# Patient Record
Sex: Female | Born: 1965 | Race: White | Hispanic: No | Marital: Married | Smoking: Never smoker
Health system: Southern US, Community
[De-identification: ages and names within clinical notes are randomized; demographics above are authoritative.]

## PROBLEM LIST (undated history)

## (undated) DIAGNOSIS — R748 Abnormal levels of other serum enzymes: Secondary | ICD-10-CM

## (undated) DIAGNOSIS — K5720 Diverticulitis of large intestine with perforation and abscess without bleeding: Secondary | ICD-10-CM

## (undated) DIAGNOSIS — K219 Gastro-esophageal reflux disease without esophagitis: Secondary | ICD-10-CM

## (undated) DIAGNOSIS — I10 Essential (primary) hypertension: Secondary | ICD-10-CM

## (undated) HISTORY — DX: Essential (primary) hypertension: I10

## (undated) HISTORY — DX: Diverticulitis of large intestine with perforation and abscess without bleeding: K57.20

## (undated) HISTORY — PX: UPPER GASTROINTESTINAL ENDOSCOPY: SHX188

---

## 2001-09-20 ENCOUNTER — Other Ambulatory Visit: Admission: RE | Admit: 2001-09-20 | Discharge: 2001-09-20 | Payer: Self-pay | Admitting: Obstetrics and Gynecology

## 2002-10-23 ENCOUNTER — Other Ambulatory Visit: Admission: RE | Admit: 2002-10-23 | Discharge: 2002-10-23 | Payer: Self-pay | Admitting: Obstetrics and Gynecology

## 2003-10-31 ENCOUNTER — Other Ambulatory Visit: Admission: RE | Admit: 2003-10-31 | Discharge: 2003-10-31 | Payer: Self-pay | Admitting: Obstetrics and Gynecology

## 2005-06-24 ENCOUNTER — Other Ambulatory Visit: Admission: RE | Admit: 2005-06-24 | Discharge: 2005-06-24 | Payer: Self-pay | Admitting: Obstetrics and Gynecology

## 2012-07-16 ENCOUNTER — Ambulatory Visit (HOSPITAL_COMMUNITY)
Admission: RE | Admit: 2012-07-16 | Discharge: 2012-07-16 | Disposition: A | Discharge status: Home / Self Care | Payer: BC Managed Care – PPO | Source: Ambulatory Visit | Attending: *Deleted | Admitting: *Deleted

## 2012-07-16 ENCOUNTER — Inpatient Hospital Stay (HOSPITAL_COMMUNITY)
Admission: EM | Admit: 2012-07-16 | Discharge: 2012-07-20 | DRG: 183 | Disposition: A | Discharge status: Home / Self Care | Payer: BC Managed Care – PPO | Attending: Surgery | Admitting: Surgery

## 2012-07-16 ENCOUNTER — Encounter (HOSPITAL_COMMUNITY): Payer: Self-pay | Admitting: *Deleted

## 2012-07-16 ENCOUNTER — Ambulatory Visit (HOSPITAL_COMMUNITY)
Admission: AD | Admit: 2012-07-16 | Discharge: 2012-07-16 | Disposition: A | Discharge status: Home / Self Care | Payer: BC Managed Care – PPO | Source: Ambulatory Visit | Attending: *Deleted | Admitting: *Deleted

## 2012-07-16 ENCOUNTER — Other Ambulatory Visit (HOSPITAL_COMMUNITY): Payer: Self-pay | Admitting: *Deleted

## 2012-07-16 DIAGNOSIS — Z23 Encounter for immunization: Secondary | ICD-10-CM

## 2012-07-16 DIAGNOSIS — R109 Unspecified abdominal pain: Secondary | ICD-10-CM

## 2012-07-16 DIAGNOSIS — K5780 Diverticulitis of intestine, part unspecified, with perforation and abscess without bleeding: Secondary | ICD-10-CM

## 2012-07-16 DIAGNOSIS — K5732 Diverticulitis of large intestine without perforation or abscess without bleeding: Secondary | ICD-10-CM

## 2012-07-16 LAB — CBC WITH DIFFERENTIAL/PLATELET
Basophils Absolute: 0 10*3/uL (ref 0.0–0.1)
Basophils Relative: 0 % (ref 0–1)
Eosinophils Absolute: 0.1 10*3/uL (ref 0.0–0.7)
Eosinophils Relative: 1 % (ref 0–5)
HCT: 39.4 % (ref 36.0–46.0)
Hemoglobin: 13.3 g/dL (ref 12.0–15.0)
Lymphocytes Relative: 20 % (ref 12–46)
Lymphs Abs: 2.4 10*3/uL (ref 0.7–4.0)
MCH: 29.7 pg (ref 26.0–34.0)
MCHC: 33.8 g/dL (ref 30.0–36.0)
MCV: 87.9 fL (ref 78.0–100.0)
Monocytes Absolute: 0.8 10*3/uL (ref 0.1–1.0)
Monocytes Relative: 7 % (ref 3–12)
Neutro Abs: 8.5 10*3/uL — ABNORMAL HIGH (ref 1.7–7.7)
Neutrophils Relative %: 72 % (ref 43–77)
Platelets: 205 10*3/uL (ref 150–400)
RBC: 4.48 MIL/uL (ref 3.87–5.11)
RDW: 12.7 % (ref 11.5–15.5)
WBC: 11.8 10*3/uL — ABNORMAL HIGH (ref 4.0–10.5)

## 2012-07-16 LAB — BASIC METABOLIC PANEL
BUN: 9 mg/dL (ref 6–23)
CO2: 27 mEq/L (ref 19–32)
Calcium: 9 mg/dL (ref 8.4–10.5)
Chloride: 96 mEq/L (ref 96–112)
Creatinine, Ser: 0.78 mg/dL (ref 0.50–1.10)
GFR calc Af Amer: >90 mL/min (ref >90)
GFR calc non Af Amer: >90 mL/min (ref >90)
Glucose, Bld: 91 mg/dL (ref 70–99)
Potassium: 3.1 mEq/L — ABNORMAL LOW (ref 3.5–5.1)
Sodium: 134 mEq/L — ABNORMAL LOW (ref 135–145)

## 2012-07-16 LAB — LACTIC ACID, PLASMA: Lactic Acid, Venous: 1.2 mmol/L (ref 0.5–2.2)

## 2012-07-16 MED ORDER — DIPHENHYDRAMINE HCL 50 MG/ML IJ SOLN: 12.5000 mg | Freq: Four times a day (QID) | INTRAMUSCULAR | Status: DC | PRN

## 2012-07-16 MED ORDER — DIPHENHYDRAMINE HCL 12.5 MG/5ML PO ELIX: 12.5000 mg | ORAL_SOLUTION | Freq: Four times a day (QID) | ORAL | Status: DC | PRN

## 2012-07-16 MED ORDER — KCL IN DEXTROSE-NACL 20-5-0.45 MEQ/L-%-% IV SOLN
INTRAVENOUS | Status: DC
  Administered 2012-07-17 – 2012-07-18 (×4): via INTRAVENOUS
  Filled 2012-07-16 (×10): qty 1000

## 2012-07-16 MED ORDER — HYDROMORPHONE HCL PF 1 MG/ML IJ SOLN: 0.5000 mg | INTRAMUSCULAR | Status: DC | PRN

## 2012-07-16 MED ORDER — SODIUM CHLORIDE 0.9 % IV BOLUS (SEPSIS)
1000.0000 mL | Freq: Once | INTRAVENOUS | Status: AC
  Administered 2012-07-16: 1000 mL via INTRAVENOUS

## 2012-07-16 MED ORDER — METRONIDAZOLE IN NACL 5-0.79 MG/ML-% IV SOLN
500.0000 mg | Freq: Once | INTRAVENOUS | Status: AC
  Administered 2012-07-16: 500 mg via INTRAVENOUS
  Filled 2012-07-16: qty 100

## 2012-07-16 MED ORDER — PANTOPRAZOLE SODIUM 40 MG IV SOLR
40.0000 mg | Freq: Every day | INTRAVENOUS | Status: DC
  Administered 2012-07-17 – 2012-07-18 (×3): 40 mg via INTRAVENOUS
  Filled 2012-07-16 (×4): qty 40

## 2012-07-16 MED ORDER — IOHEXOL 300 MG/ML  SOLN
100.0000 mL | Freq: Once | INTRAMUSCULAR | Status: AC | PRN
  Administered 2012-07-16: 100 mL via INTRAVENOUS

## 2012-07-16 MED ORDER — CIPROFLOXACIN IN D5W 400 MG/200ML IV SOLN
400.0000 mg | Freq: Once | INTRAVENOUS | Status: AC
  Administered 2012-07-16: 400 mg via INTRAVENOUS
  Filled 2012-07-16: qty 200

## 2012-07-16 MED ORDER — ENOXAPARIN SODIUM 40 MG/0.4ML ~~LOC~~ SOLN
40.0000 mg | SUBCUTANEOUS | Status: DC
  Administered 2012-07-17 – 2012-07-19 (×3): 40 mg via SUBCUTANEOUS
  Filled 2012-07-16 (×4): qty 0.4

## 2012-07-16 MED ORDER — METRONIDAZOLE IN NACL 5-0.79 MG/ML-% IV SOLN
500.0000 mg | Freq: Three times a day (TID) | INTRAVENOUS | Status: DC
Start: 2012-07-17 — End: 2012-07-20
  Administered 2012-07-17 – 2012-07-20 (×9): 500 mg via INTRAVENOUS
  Filled 2012-07-16 (×13): qty 100

## 2012-07-16 MED ORDER — ONDANSETRON HCL 4 MG/2ML IJ SOLN: 4.0000 mg | Freq: Four times a day (QID) | INTRAMUSCULAR | Status: DC | PRN

## 2012-07-16 MED ORDER — CIPROFLOXACIN IN D5W 400 MG/200ML IV SOLN
400.0000 mg | Freq: Two times a day (BID) | INTRAVENOUS | Status: DC
  Administered 2012-07-17 – 2012-07-19 (×6): 400 mg via INTRAVENOUS
  Filled 2012-07-16 (×8): qty 200

## 2012-07-16 NOTE — ED Provider Notes (Signed)
History    46 year old female with abdominal pain. Gradual onset about a week ago. Left-sided. Progressively worsening. Relatively constant. Associated with multiple episodes of diarrhea. Nonbloody. Mild nausea but no vomiting. No fevers or chills. Patient was referred by her PCP for CT scan today. This was significant for diverticulitis with a small amount of intraluminal air consistent with a microperforation. No evidence of abscess. Referred to emergency room. Patient is otherwise healthy. No previous abdominal or pelvic surgeries.  CSN: [id number]  Arrival date & time 07/16/12  1951   First MD Initiated Contact with Patient 07/16/12 1954      Chief Complaint  Patient presents with  . Abdominal Pain    (Consider location/radiation/quality/duration/timing/severity/associated sxs/prior treatment) HPI  History reviewed. No pertinent past medical history.  History reviewed. No pertinent past surgical history.  History reviewed. No pertinent family history.  History  Substance Use Topics  . Smoking status: Never Smoker   . Smokeless tobacco: Not on file  . Alcohol Use: No    OB History    Grav Para Term Preterm Abortions TAB SAB Ect Mult Living                  Review of Systems   Review of symptoms negative unless otherwise noted in HPI.   Allergies  Review of patient's allergies indicates no known allergies.  Home Medications   Current Outpatient Rx  Name  Route  Sig  Dispense  Refill  . IBUPROFEN 200 MG PO TABS   Oral   Take 200 mg by mouth every 6 (six) hours as needed. For pain           There were no vitals taken for this visit.  Physical Exam  Nursing note and vitals reviewed. Constitutional: She appears well-developed and well-nourished. No distress.  HENT:  Head: Normocephalic and atraumatic.  Eyes: Conjunctivae normal are normal. Right eye exhibits no discharge. Left eye exhibits no discharge.  Neck: Neck supple.  Cardiovascular: Normal rate,  regular rhythm and normal heart sounds.  Exam reveals no gallop and no friction rub.   No murmur heard. Pulmonary/Chest: Effort normal and breath sounds normal. No respiratory distress.  Abdominal: Soft. She exhibits no distension. There is tenderness.       Mild left-sided abdominal tenderness. No rebound or guarding. No distention. No surgical scars noted.  Musculoskeletal: She exhibits no edema and no tenderness.  Neurological: She is alert.  Skin: Skin is warm and dry.  Psychiatric: She has a normal mood and affect. Her behavior is normal. Thought content normal.    ED Course  Procedures (including critical care time)  Labs Reviewed  CBC WITH DIFFERENTIAL - Abnormal; Notable for the following:    WBC 11.8 (*)     Neutro Abs 8.5 (*)     All other components within normal limits  BASIC METABOLIC PANEL - Abnormal; Notable for the following:    Sodium 134 (*)     Potassium 3.1 (*)     All other components within normal limits  LACTIC ACID, PLASMA   Ct Abdomen Pelvis W Contrast  07/16/2012  *RADIOLOGY REPORT*  Clinical Data: 1-week history of left lower quadrant abdominal pain, nausea and vomiting, and diarrhea.  CT ABDOMEN AND PELVIS WITH CONTRAST  Technique:  Multidetector CT imaging of the abdomen and pelvis was performed following the standard protocol during bolus administration of intravenous contrast.  Contrast: OMNIPAQUE IOHEXOL 300 MG/ML. Oral contrast was also administered.  Comparison: None.  Findings:  Wall thickening involving the distal sigmoid colon where there is a diverticulum.  There is associated pericolonic edema/inflammation.  Extraluminal gas is present, though there is no associated fluid collection.  The remainder the colon is normal in appearance.  Stomach decompressed and unremarkable by CT.  Wall thickening involving the terminal ileum over an approximate 6 cm segment. Remainder of the small bowel normal in appearance.  Normal appendix in the right upper  pelvis.  No ascites.  Normal appearing liver, spleen, pancreas, adrenal glands, and kidneys.  Focus of accessory splenic tissue medial to the lower pole of the spleen.  No visible aorto-iliofemoral atherosclerosis. No significant lymphadenopathy.  Gallbladder unremarkable by CT. No biliary ductal dilation.  Uterus and ovaries normal in appearance.  No free pelvic fluid. Urinary bladder unremarkable.  Bone window images demonstrate lower thoracic spondylosis and degenerative changes involving the lower lumbar facet joints.  Visualized lung bases clear.  IMPRESSION:  1.  Acute diverticulitis involving the distal sigmoid colon.  Small amount of extraluminal gas consistent with a microperforation.  No associated abscess. 2.  Wall thickening over approximate 6 cm segment of the terminal ileum.  This could be infectious or inflammatory (Crohn's?)   Original Report Authenticated By: Hulan Saas, M.D.      1. Perforated diverticulitis       MDM  46 year old female with abdominal pain. CT significant for perforated diverticulitis. Not peritoneal.  N.p.o. Antibiotics. Surgical consult. Admission.        Raeford Razor, MD 07/16/12 2110

## 2012-07-16 NOTE — H&P (Signed)
Lisa Tyler is an 46 y.o. female.   Chief Complaint: Left lower quadrant abdominal pain and fever HPI: Patient developed left-sided abdominal pain and fever 6 days ago. It improved after 2 days but returned earlier today with more fever. The pain is now more in the left lower quadrant. She has associated nausea but no vomiting. Bowel movements have been loose. She underwent CT scan of the abdomen and pelvis in the emergency department and shows sigmoid diverticulitis with microperforation. It also shows some thickening of the terminal ileum. There is no abscess.  History reviewed. No pertinent past medical history.  History reviewed. No pertinent past surgical history.  History reviewed. No pertinent family history. Social History:  reports that she has never smoked. She does not have any smokeless tobacco history on file. She reports that she does not drink alcohol or use illicit drugs.  Allergies: No Known Allergies   (Not in a hospital admission)  Results for orders placed during the hospital encounter of 07/16/12 (from the past 48 hour(s))  CBC WITH DIFFERENTIAL     Status: Abnormal   Collection Time   07/16/12  7:59 PM      Component Value Range Comment   WBC 11.8 (*) 4.0 - 10.5 K/uL    RBC 4.48  3.87 - 5.11 MIL/uL    Hemoglobin 13.3  12.0 - 15.0 g/dL    HCT 16.1  09.6 - 04.5 %    MCV 87.9  78.0 - 100.0 fL    MCH 29.7  26.0 - 34.0 pg    MCHC 33.8  30.0 - 36.0 g/dL    RDW 40.9  81.1 - 91.4 %    Platelets 205  150 - 400 K/uL    Neutrophils Relative 72  43 - 77 %    Lymphocytes Relative 20  12 - 46 %    Monocytes Relative 7  3 - 12 %    Eosinophils Relative 1  0 - 5 %    Basophils Relative 0  0 - 1 %    Neutro Abs 8.5 (*) 1.7 - 7.7 K/uL    Lymphs Abs 2.4  0.7 - 4.0 K/uL    Monocytes Absolute 0.8  0.1 - 1.0 K/uL    Eosinophils Absolute 0.1  0.0 - 0.7 K/uL    Basophils Absolute 0.0  0.0 - 0.1 K/uL   BASIC METABOLIC PANEL     Status: Abnormal   Collection Time   07/16/12   7:59 PM      Component Value Range Comment   Sodium 134 (*) 135 - 145 mEq/L    Potassium 3.1 (*) 3.5 - 5.1 mEq/L    Chloride 96  96 - 112 mEq/L    CO2 27  19 - 32 mEq/L    Glucose, Bld 91  70 - 99 mg/dL    BUN 9  6 - 23 mg/dL    Creatinine, Ser 7.82  0.50 - 1.10 mg/dL    Calcium 9.0  8.4 - 95.6 mg/dL    GFR calc non Af Amer >90  >90 mL/min    GFR calc Af Amer >90  >90 mL/min   LACTIC ACID, PLASMA     Status: Normal   Collection Time   07/16/12  7:59 PM      Component Value Range Comment   Lactic Acid, Venous 1.2  0.5 - 2.2 mmol/L    Ct Abdomen Pelvis W Contrast  07/16/2012  *RADIOLOGY REPORT*  Clinical Data: 1-week history  of left lower quadrant abdominal pain, nausea and vomiting, and diarrhea.  CT ABDOMEN AND PELVIS WITH CONTRAST  Technique:  Multidetector CT imaging of the abdomen and pelvis was performed following the standard protocol during bolus administration of intravenous contrast.  Contrast: OMNIPAQUE IOHEXOL 300 MG/ML. Oral contrast was also administered.  Comparison: None.  Findings: Wall thickening involving the distal sigmoid colon where there is a diverticulum.  There is associated pericolonic edema/inflammation.  Extraluminal gas is present, though there is no associated fluid collection.  The remainder the colon is normal in appearance.  Stomach decompressed and unremarkable by CT.  Wall thickening involving the terminal ileum over an approximate 6 cm segment. Remainder of the small bowel normal in appearance.  Normal appendix in the right upper pelvis.  No ascites.  Normal appearing liver, spleen, pancreas, adrenal glands, and kidneys.  Focus of accessory splenic tissue medial to the lower pole of the spleen.  No visible aorto-iliofemoral atherosclerosis. No significant lymphadenopathy.  Gallbladder unremarkable by CT. No biliary ductal dilation.  Uterus and ovaries normal in appearance.  No free pelvic fluid. Urinary bladder unremarkable.  Bone window images demonstrate  lower thoracic spondylosis and degenerative changes involving the lower lumbar facet joints.  Visualized lung bases clear.  IMPRESSION:  1.  Acute diverticulitis involving the distal sigmoid colon.  Small amount of extraluminal gas consistent with a microperforation.  No associated abscess. 2.  Wall thickening over approximate 6 cm segment of the terminal ileum.  This could be infectious or inflammatory (Crohn's?)   Original Report Authenticated By: Hulan Saas, M.D.     Review of Systems  Constitutional: Positive for fever. Negative for chills.  HENT: Negative.   Eyes: Negative.   Respiratory: Negative.   Cardiovascular: Negative.   Gastrointestinal: Positive for nausea, abdominal pain and diarrhea. Negative for vomiting and constipation.  Genitourinary: Negative.   Musculoskeletal: Negative.   Skin: Negative.   Neurological: Negative.   Endo/Heme/Allergies: Negative.     Blood pressure 120/74, pulse 99, temperature 99 F (37.2 C), temperature source Oral, resp. rate 18, SpO2 95.00%. Physical Exam  Constitutional: She is oriented to person, place, and time. She appears well-developed and well-nourished. No distress.  HENT:  Head: Normocephalic and atraumatic.  Mouth/Throat: No oropharyngeal exudate.  Eyes: EOM are normal. Pupils are equal, round, and reactive to light. No scleral icterus.  Neck: Normal range of motion. No tracheal deviation present.  Cardiovascular: Normal rate, regular rhythm, normal heart sounds and intact distal pulses.   Respiratory: Effort normal and breath sounds normal. No stridor. No respiratory distress. She has no wheezes. She has no rales.  GI: Soft. She exhibits no distension. There is tenderness. There is no rebound and no guarding.       Tender left lower quadrant and suprapubic with no guarding  Musculoskeletal: Normal range of motion.  Neurological: She is alert and oriented to person, place, and time.  Skin: Skin is warm.      Assessment/Plan Sigmoid diverticulitis with microperforation. Possible inflammatory bowel disease involving the terminal ileum. Plan admission, bowel rest, and IV antibiotics. If the patient worsens, she will need colectomy and colostomy. This was discussed with her. I answered her questions.  Lennon Richins E 07/16/2012, 9:31 PM

## 2012-07-16 NOTE — ED Notes (Signed)
Pt ambulatory to restroom with steady gait, pt denies any diarrhea episode. Pt continues to deny any pain or complaints at this time and will continue to monitor pt.

## 2012-07-16 NOTE — ED Notes (Signed)
Pt states she has no history of diverticulitis. Pt states that her abdominal pain started Mon. Generalized abdominal pain then throughout the week it "settled" to her left lower abdomen. Pt saw PCP and was sent over for a outpatient CT scan. Pt scan did show diverticulitis and a micro-bowel perforation. PCP wanted pt seen in ED and admitted for observation of bowel perforation.

## 2012-07-17 LAB — CBC
HCT: 34.5 % — ABNORMAL LOW (ref 36.0–46.0)
Hemoglobin: 11.4 g/dL — ABNORMAL LOW (ref 12.0–15.0)
MCH: 29.2 pg (ref 26.0–34.0)
MCHC: 33 g/dL (ref 30.0–36.0)
MCV: 88.2 fL (ref 78.0–100.0)
Platelets: 200 10*3/uL (ref 150–400)
RBC: 3.91 MIL/uL (ref 3.87–5.11)
RDW: 12.8 % (ref 11.5–15.5)
WBC: 9 10*3/uL (ref 4.0–10.5)

## 2012-07-17 LAB — BASIC METABOLIC PANEL
BUN: 6 mg/dL (ref 6–23)
CO2: 27 mEq/L (ref 19–32)
Calcium: 8.3 mg/dL — ABNORMAL LOW (ref 8.4–10.5)
Chloride: 104 mEq/L (ref 96–112)
Creatinine, Ser: 0.78 mg/dL (ref 0.50–1.10)
GFR calc Af Amer: >90 mL/min (ref >90)
GFR calc non Af Amer: >90 mL/min (ref >90)
Glucose, Bld: 98 mg/dL (ref 70–99)
Potassium: 3.4 mEq/L — ABNORMAL LOW (ref 3.5–5.1)
Sodium: 140 mEq/L (ref 135–145)

## 2012-07-17 MED ORDER — ACETAMINOPHEN 325 MG PO TABS
650.0000 mg | ORAL_TABLET | Freq: Four times a day (QID) | ORAL | Status: DC | PRN
  Administered 2012-07-17: 650 mg via ORAL
  Filled 2012-07-17 (×2): qty 2

## 2012-07-17 MED ORDER — INFLUENZA VIRUS VACC SPLIT PF IM SUSP
0.5000 mL | INTRAMUSCULAR | Status: AC
  Administered 2012-07-17: 0.5 mL via INTRAMUSCULAR
  Filled 2012-07-17: qty 0.5

## 2012-07-17 NOTE — Progress Notes (Signed)
Patient ID: Lisa Tyler, female   DOB: [birth year], 46 y.o.   MRN: [id number]    Subjective: Patient doing well today, minimal pain except with ambulation.  Pt denies any BM, but has + flatus and normal urination.  Pt is hungry and thirsty.  Pt c/o of a headache, but no other complaints.  Pt's temp was elevated at 100.3*F.  Objective: Vital signs in last 24 hours: Temp:  [98.8 F (37.1 C)-100.3 F (37.9 C)] 100.3 F (37.9 C) (11/04 0757) Pulse Rate:  [92-99] 92  (11/04 0757) Resp:  [16-19] 18  (11/04 0754) BP: (113-123)/(63-76) 118/73 mmHg (11/04 0757) SpO2:  [93 %-97 %] 95 % (11/04 0757) Weight:  [214 lb 11.2 oz (97.387 kg)] 214 lb 11.2 oz (97.387 kg) (11/04 0300) Last BM Date: 07/16/12   PE: Gen:  Alert, NAD, pleasant Abd: Soft, +BS, minimal distension and moderate TTP in RLQ  Lab Results:   [facility] 07/17/12 0635 07/16/12 1959  WBC 9.0 11.8*  HGB 11.4* 13.3  HCT 34.5* 39.4  PLT 200 205   BMET  Basename 07/17/12 0635 07/16/12 1959  NA 140 134*  K 3.4* 3.1*  CL 104 96  CO2 27 27  GLUCOSE 98 91  BUN 6 9  CREATININE 0.78 0.78  CALCIUM 8.3* 9.0   PT/INR No results found for this basename: LABPROT:2,INR:2 in the last 72 hours CMP     Component Value Date/Time   NA 140 07/17/2012 0635   K 3.4* 07/17/2012 0635   CL 104 07/17/2012 0635   CO2 27 07/17/2012 0635   GLUCOSE 98 07/17/2012 0635   BUN 6 07/17/2012 0635   CREATININE 0.78 07/17/2012 0635   CALCIUM 8.3* 07/17/2012 0635   GFRNONAA >90 07/17/2012 0635   GFRAA >90 07/17/2012 0635   Lipase  No results found for this basename: lipase       Studies/Results: Ct Abdomen Pelvis W Contrast  07/16/2012  *RADIOLOGY REPORT*  Clinical Data: 1-week history of left lower quadrant abdominal pain, nausea and vomiting, and diarrhea.  CT ABDOMEN AND PELVIS WITH CONTRAST  Technique:  Multidetector CT imaging of the abdomen and pelvis was performed following the standard protocol during bolus administration of intravenous  contrast.  Contrast: OMNIPAQUE IOHEXOL 300 MG/ML. Oral contrast was also administered.  Comparison: None.  Findings: Wall thickening involving the distal sigmoid colon where there is a diverticulum.  There is associated pericolonic edema/inflammation.  Extraluminal gas is present, though there is no associated fluid collection.  The remainder the colon is normal in appearance.  Stomach decompressed and unremarkable by CT.  Wall thickening involving the terminal ileum over an approximate 6 cm segment. Remainder of the small bowel normal in appearance.  Normal appendix in the right upper pelvis.  No ascites.  Normal appearing liver, spleen, pancreas, adrenal glands, and kidneys.  Focus of accessory splenic tissue medial to the lower pole of the spleen.  No visible aorto-iliofemoral atherosclerosis. No significant lymphadenopathy.  Gallbladder unremarkable by CT. No biliary ductal dilation.  Uterus and ovaries normal in appearance.  No free pelvic fluid. Urinary bladder unremarkable.  Bone window images demonstrate lower thoracic spondylosis and degenerative changes involving the lower lumbar facet joints.  Visualized lung bases clear.  IMPRESSION:  1.  Acute diverticulitis involving the distal sigmoid colon.  Small amount of extraluminal gas consistent with a microperforation.  No associated abscess. 2.  Wall thickening over approximate 6 cm segment of the terminal ileum.  This could be infectious or inflammatory (Crohn's?)  Original Report Authenticated By: Hulan Saas, M.D.     Anti-infectives: Anti-infectives     Start     Dose/Rate Route Frequency Ordered Stop   07/17/12 0900   ciprofloxacin (CIPRO) IVPB 400 mg        400 mg 200 mL/hr over 60 Minutes Intravenous Every 12 hours 07/16/12 2337     07/17/12 0600   metroNIDAZOLE (FLAGYL) IVPB 500 mg        500 mg 100 mL/hr over 60 Minutes Intravenous Every 8 hours 07/16/12 2337     07/16/12 2000   ciprofloxacin (CIPRO) IVPB 400 mg        400  mg 200 mL/hr over 60 Minutes Intravenous  Once 07/16/12 1956 07/16/12 2231   07/16/12 2000   metroNIDAZOLE (FLAGYL) IVPB 500 mg        500 mg 100 mL/hr over 60 Minutes Intravenous  Once 07/16/12 1956 07/16/12 2331           Assessment/Plan  46 y/o female with perforated diverticulitis 2.  Cont antibiotics 3.  Ambulate QID 4.  IS 5.  Bowel rest-will hold off on feeding this AM given intermittent pain, but will likely start clears later today or tomorrow AM.  Headache 1.  PO tylenol with sips only    LOS: 1 day    DORT, Felita Bump 07/17/2012, 8:25 AM Pager: [phone]

## 2012-07-17 NOTE — Progress Notes (Signed)
Continue non-op management Adv diet slowly next few days  The patient is stable.  There is no evidence of peritonitis, acute abdomen, nor shock.  There is no strong evidence of failure of improvement nor decline with current non-operative management.  There is no need for surgery at the present moment.   We will continue to follow.

## 2012-07-18 MED ORDER — KCL IN DEXTROSE-NACL 20-5-0.45 MEQ/L-%-% IV SOLN
INTRAVENOUS | Status: DC
  Administered 2012-07-19 – 2012-07-20 (×2): via INTRAVENOUS
  Filled 2012-07-18 (×4): qty 1000

## 2012-07-18 NOTE — Progress Notes (Signed)
Subjective: Patient doing very well today and is hungry and thirsty.  Pt denies any abdominal pain, N/V.  Pt with +flatus and BM yesterday and this AM.  Pt up ambulating and tolerating ice chips well.    Objective: Vital signs in last 24 hours: Temp:  [97.1 F (36.2 C)-99.3 F (37.4 C)] 97.8 F (36.6 C) (11/05 0618) Pulse Rate:  [76-85] 76  (11/05 0618) Resp:  [18] 18  (11/05 0618) BP: (102-126)/(65-76) 102/65 mmHg (11/05 0618) SpO2:  [98 %-100 %] 98 % (11/05 0618) Last BM Date: 07/16/12  Intake/Output from previous day: 11/04 0701 - 11/05 0700 In: 3540 [I.V.:3240; IV Piggyback:300] Out: 5 [Urine:4; Stool:1] Intake/Output this shift:    PE: Gen:  Alert, NAD, pleasant Abd: Soft, NT/ND, +BS   Lab Results:   [facility] 07/17/12 0635 07/16/12 1959  WBC 9.0 11.8*  HGB 11.4* 13.3  HCT 34.5* 39.4  PLT 200 205   BMET  Basename 07/17/12 0635 07/16/12 1959  NA 140 134*  K 3.4* 3.1*  CL 104 96  CO2 27 27  GLUCOSE 98 91  BUN 6 9  CREATININE 0.78 0.78  CALCIUM 8.3* 9.0   PT/INR No results found for this basename: LABPROT:2,INR:2 in the last 72 hours CMP     Component Value Date/Time   NA 140 07/17/2012 0635   K 3.4* 07/17/2012 0635   CL 104 07/17/2012 0635   CO2 27 07/17/2012 0635   GLUCOSE 98 07/17/2012 0635   BUN 6 07/17/2012 0635   CREATININE 0.78 07/17/2012 0635   CALCIUM 8.3* 07/17/2012 0635   GFRNONAA >90 07/17/2012 0635   GFRAA >90 07/17/2012 0635   Lipase  No results found for this basename: lipase       Studies/Results: Ct Abdomen Pelvis W Contrast  07/16/2012  *RADIOLOGY REPORT*  Clinical Data: 1-week history of left lower quadrant abdominal pain, nausea and vomiting, and diarrhea.  CT ABDOMEN AND PELVIS WITH CONTRAST  Technique:  Multidetector CT imaging of the abdomen and pelvis was performed following the standard protocol during bolus administration of intravenous contrast.  Contrast: OMNIPAQUE IOHEXOL 300 MG/ML. Oral contrast was also  administered.  Comparison: None.  Findings: Wall thickening involving the distal sigmoid colon where there is a diverticulum.  There is associated pericolonic edema/inflammation.  Extraluminal gas is present, though there is no associated fluid collection.  The remainder the colon is normal in appearance.  Stomach decompressed and unremarkable by CT.  Wall thickening involving the terminal ileum over an approximate 6 cm segment. Remainder of the small bowel normal in appearance.  Normal appendix in the right upper pelvis.  No ascites.  Normal appearing liver, spleen, pancreas, adrenal glands, and kidneys.  Focus of accessory splenic tissue medial to the lower pole of the spleen.  No visible aorto-iliofemoral atherosclerosis. No significant lymphadenopathy.  Gallbladder unremarkable by CT. No biliary ductal dilation.  Uterus and ovaries normal in appearance.  No free pelvic fluid. Urinary bladder unremarkable.  Bone window images demonstrate lower thoracic spondylosis and degenerative changes involving the lower lumbar facet joints.  Visualized lung bases clear.  IMPRESSION:  1.  Acute diverticulitis involving the distal sigmoid colon.  Small amount of extraluminal gas consistent with a microperforation.  No associated abscess. 2.  Wall thickening over approximate 6 cm segment of the terminal ileum.  This could be infectious or inflammatory (Crohn's?)   Original Report Authenticated By: Hulan Saas, M.D.     Anti-infectives: Anti-infectives     Start  Dose/Rate Route Frequency Ordered Stop   07/17/12 0900   ciprofloxacin (CIPRO) IVPB 400 mg        400 mg 200 mL/hr over 60 Minutes Intravenous Every 12 hours 07/16/12 2337     07/17/12 0600   metroNIDAZOLE (FLAGYL) IVPB 500 mg        500 mg 100 mL/hr over 60 Minutes Intravenous Every 8 hours 07/16/12 2337     07/16/12 2000   ciprofloxacin (CIPRO) IVPB 400 mg        400 mg 200 mL/hr over 60 Minutes Intravenous  Once 07/16/12 1956 07/16/12 2231    07/16/12 2000   metroNIDAZOLE (FLAGYL) IVPB 500 mg        500 mg 100 mL/hr over 60 Minutes Intravenous  Once 07/16/12 1956 07/16/12 2331           Assessment/Plan  46 y/o female with Perforated diverticulitis 1.  Much improved today, will progress diet to sips (30ml q1h prn) and clears at lunch if tolerated 2.  Cont antibiotics and pain control 3.  Cont ambulation OOB and IS 4.  Will likely go home in a few days    LOS: 2 days    DORT, Demetrus Pavao 07/18/2012, 8:29 AM Pager: [phone]

## 2012-07-18 NOTE — Progress Notes (Signed)
Improving Try to adv diet Cont IV ABx  Hopefully improves, but... CTscan if worse OR if unstable

## 2012-07-19 MED ORDER — PANTOPRAZOLE SODIUM 40 MG PO TBEC
40.0000 mg | DELAYED_RELEASE_TABLET | Freq: Every day | ORAL | Status: DC
  Administered 2012-07-19: 40 mg via ORAL
  Filled 2012-07-19: qty 1

## 2012-07-19 MED ORDER — HYDROCODONE-ACETAMINOPHEN 10-325 MG PO TABS: 2.0000 | ORAL_TABLET | ORAL | Status: DC | PRN

## 2012-07-19 NOTE — Progress Notes (Signed)
  Subjective: Patient has had cervical loose stools. States pain has resolved. Now hungry. Tolerated clear liquids.  Remains afebrile with stable vital signs.  Objective: Vital signs in last 24 hours: Temp:  [97.5 F (36.4 C)-98.2 F (36.8 C)] 98.2 F (36.8 C) (11/06 0648) Pulse Rate:  [72-85] 72  (11/06 0648) Resp:  [18] 18  (11/06 0648) BP: (110-120)/(70-74) 120/72 mmHg (11/06 0648) SpO2:  [97 %-99 %] 97 % (11/06 0648) Last BM Date: 07/18/12  Intake/Output from previous day: 11/05 0701 - 11/06 0700 In: 504.2 [P.O.:240; I.V.:264.2] Out: 9 [Urine:3; Stool:6] Intake/Output this shift:    General appearance: alert. No distress. Comfortable. Normal mental status. GI: abdomen soft. Obese. Nontender. No mass. Not distended. Benign exam.  Lab Results:   [facility] 07/17/12 0635 07/16/12 1959  WBC 9.0 11.8*  HGB 11.4* 13.3  HCT 34.5* 39.4  PLT 200 205   BMET  Basename 07/17/12 0635 07/16/12 1959  NA 140 134*  K 3.4* 3.1*  CL 104 96  CO2 27 27  GLUCOSE 98 91  BUN 6 9  CREATININE 0.78 0.78  CALCIUM 8.3* 9.0   PT/INR No results found for this basename: LABPROT:2,INR:2 in the last 72 hours ABG No results found for this basename: PHART:2,PCO2:2,PO2:2,HCO3:2 in the last 72 hours  Studies/Results: No results found.  Anti-infectives: Anti-infectives     Start     Dose/Rate Route Frequency Ordered Stop   07/17/12 0900   ciprofloxacin (CIPRO) IVPB 400 mg        400 mg 200 mL/hr over 60 Minutes Intravenous Every 12 hours 07/16/12 2337     07/17/12 0600   metroNIDAZOLE (FLAGYL) IVPB 500 mg        500 mg 100 mL/hr over 60 Minutes Intravenous Every 8 hours 07/16/12 2337     07/16/12 2000   ciprofloxacin (CIPRO) IVPB 400 mg        400 mg 200 mL/hr over 60 Minutes Intravenous  Once 07/16/12 1956 07/16/12 2231   07/16/12 2000   metroNIDAZOLE (FLAGYL) IVPB 500 mg        500 mg 100 mL/hr over 60 Minutes Intravenous  Once 07/16/12 1956 07/16/12 2331           Assessment/Plan:  Acute sigmoid diverticulitis with microperforation, first episode. Responding to antibiotics without apparent complication. Continue IV antibiotics Switch to oral pain medication and proton pump inhibitors Advance diet Possible discharge home tomorrow. Would probably give 2 weeks of Cipro and Flagyl Refer for colonoscopy in approximately 2 months.   LOS: 3 days    Marigene Erler M. Derrell Lolling, M.D., [facility] General and Minimally invasive Surgery Breast and Colorectal Surgery Office:   [phone] Pager:   [phone]  07/19/2012

## 2012-07-19 NOTE — Progress Notes (Signed)
Patient tolerated low residue dinner without complaints of nausea or pain.

## 2012-07-20 MED ORDER — CIPROFLOXACIN HCL 500 MG PO TABS: 500.0000 mg | ORAL_TABLET | Freq: Two times a day (BID) | ORAL | Status: DC

## 2012-07-20 MED ORDER — METRONIDAZOLE 500 MG PO TABS: 500.0000 mg | ORAL_TABLET | Freq: Three times a day (TID) | ORAL | Status: DC

## 2012-07-20 NOTE — Discharge Summary (Signed)
  Patient ID: Lisa Tyler [id number] 46 y.o. 1965/12/29  07/16/2012  Discharge date and time: July 20, 2012  Admitting Physician: Ernestene Mention  Discharge Physician: Ernestene Mention  Admission Diagnoses: Perforated diverticulitis [562.11] Sigmoid diverticulitis  Discharge Diagnoses: Sigmoid diverticulitis with perforation  Operations: None  Admission Condition: fair  Discharged Condition: good  Indication for Admission:  Left lower quadrant abdominal pain and fever   Patient developed left-sided abdominal pain and fever 6 days prior to admission.. It improved after 2 days but returned earlier today with more fever. The pain is now more in the left lower quadrant. She has associated nausea but no vomiting. Bowel movements have been loose. She underwent CT scan of the abdomen and pelvis in the emergency department and shows sigmoid diverticulitis with microperforation ,Small amount of extraluminal air, but no abscess.. It also shows some thickening of the terminal ileum. She was admitted for medical management of her diverticulitis. This is her first episode.   Hospital Course: The patient was admitted, placed on IV hydration, bowel rest, and intravenous Cipro and Flagyl. She responded very well to this with rapid reduction and eventual resolution of her pain. We advanced her diet stepwise until she was tolerating a low-residue diet, having bowel movements. She had no pain for at least 36 hours prior to discharge. 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 On the day of discharge her abdomen was soft and completely nontender. No mass. She was advised to hydration, high-fiber low-fat diet. She was told she will eventually need a colonoscopy. She was given prescription for Cipro and Flagyl for 10 days.  Consults: None  Significant Diagnostic Studies: CT scan  Treatments: antibiotics: Cipro and Flagyl  Disposition: Home  Patient Instructions:   Tuesday, [name]  Home Medication Instructions HQI:[id number]   Printed on:07/20/12 1324  Medication Information                    ibuprofen (ADVIL,MOTRIN) 200 MG tablet Take 200 mg by mouth every 6 (six) hours as needed. For pain           ciprofloxacin (CIPRO) 500 MG tablet Take 1 tablet (500 mg total) by mouth 2 (two) times daily.           metroNIDAZOLE (FLAGYL) 500 MG tablet Take 1 tablet (500 mg total) by mouth 3 (three) times daily.             Activity: activity as tolerated Diet: high fiber, low fat diet with hydration stressed. Wound Care: none needed  Follow-up:  With Dr. gross in 1 month.  Signed: Angelia Mould. Derrell Lolling, M.D., FACS General and minimally invasive surgery Breast and Colorectal Surgery  07/20/2012, 6:18 AM

## 2012-07-20 NOTE — Progress Notes (Signed)
  Subjective: Doing well. Denies pain. Denies nausea. Tolerate low-residue diet. Wants to go home. Afebrile.  Objective: Vital signs in last 24 hours: Temp:  [97.7 F (36.5 C)-98.5 F (36.9 C)] 98.5 F (36.9 C) (11/07 0500) Pulse Rate:  [72-82] 78  (11/07 0500) Resp:  [18] 18  (11/07 0500) BP: (113-123)/(70-79) 119/70 mmHg (11/07 0500) SpO2:  [97 %-100 %] 98 % (11/07 0500) Last BM Date: 07/19/12  Intake/Output from previous day: 11/06 0701 - 11/07 0700 In: 2871 [P.O.:1020; I.V.:1551; IV Piggyback:300] Out: 6 [Urine:6] Intake/Output this shift: Total I/O In: 1151 [I.V.:1151] Out: 3 [Urine:3]  General appearance: alert. Mental status normal. No distress. In good spirits. GI: soft, non-tender; bowel sounds normal; no masses,  no organomegaly  Lab Results:  No results found for this or any previous visit (from the past 24 hour(s)).   Studies/Results: @RISRSLT24 @     . ciprofloxacin  400 mg Intravenous Q12H  . enoxaparin  40 mg Subcutaneous Q24H  . metronidazole  500 mg Intravenous Q8H  . pantoprazole  40 mg Oral Daily  . [DISCONTINUED] pantoprazole (PROTONIX) IV  40 mg Intravenous QHS     Assessment/Plan:  Acute sigmoid diverticulitis with microperforation, first episode. Responding to antibiotics without apparent complication.  Discharge home today. Ten-day prescription for Cipro and Flagyl given Does not need pain medication Hydration, high fiber low fat diet discussed in detail Return to the office to see Dr. Karie Soda in 4 weeks.   Refer for colonoscopy in approximately 2 months      LOS: 4 days    Lylian Sanagustin M 07/20/2012  . .prob

## 2012-07-20 NOTE — Progress Notes (Signed)
Patient discharged to home with family.  Discharge teaching completed including follow up care, medications and diet.  Verbalizes understanding with no further questions.  Vital signs stable, no complaints of pain.

## 2012-08-22 ENCOUNTER — Encounter: Payer: Self-pay | Admitting: Gastroenterology

## 2012-08-22 ENCOUNTER — Ambulatory Visit (INDEPENDENT_AMBULATORY_CARE_PROVIDER_SITE_OTHER): Payer: BC Managed Care – PPO | Admitting: Surgery

## 2012-08-22 ENCOUNTER — Encounter (INDEPENDENT_AMBULATORY_CARE_PROVIDER_SITE_OTHER): Payer: Self-pay | Admitting: Surgery

## 2012-08-22 VITALS — BP 128/84 | HR 80 | Temp 98.4°F | Resp 18 | Ht 65.0 in | Wt 214.8 lb

## 2012-08-22 DIAGNOSIS — K5730 Diverticulosis of large intestine without perforation or abscess without bleeding: Secondary | ICD-10-CM

## 2012-08-22 DIAGNOSIS — K5720 Diverticulitis of large intestine with perforation and abscess without bleeding: Secondary | ICD-10-CM

## 2012-08-22 DIAGNOSIS — K5000 Crohn's disease of small intestine without complications: Secondary | ICD-10-CM

## 2012-08-22 DIAGNOSIS — E669 Obesity, unspecified: Secondary | ICD-10-CM | POA: Insufficient documentation

## 2012-08-22 DIAGNOSIS — K5732 Diverticulitis of large intestine without perforation or abscess without bleeding: Secondary | ICD-10-CM

## 2012-08-22 HISTORY — DX: Diverticulitis of large intestine with perforation and abscess without bleeding: K57.20

## 2012-08-22 NOTE — Progress Notes (Signed)
Subjective:     Patient ID: Lisa Tyler, female   DOB: October 17, 1965, 46 y.o.   MRN: [id number]  HPI  Lisa Tyler  01-26-1966 [id number]  Patient Care Team: Ernestina Penna, MD as PCP - General (Family Medicine)  This patient is a 46 y.o.female who presents today for surgical evaluation.   Reason for visit: Followup on attack of diverticulitis.  Pleasant obese female that had diverticulitis with a microperforation.  Was admitted and placed on IV antibiotics.  Improved transitioned to oral antibiotics.  She has not completed them.  She is pain-free now.  She is back to her baseline twice a day bowel movements.  No fevers chills or sweats. Advanced back to her usual diet.  Energy level is good.  She has never had any prior attacks of diverticulitis or any other bowel problems.  She recalls a maternal cousin with Crohn's but no other history of bowel problems.  No personal nor family history of GI/colon cancer, irritable bowel syndrome, allergy such as Celiac Sprue, dietary/dairy problems, colitis, ulcers nor gastritis.  No recent sick contacts/gastroenteritis.  No travel outside the country.  No changes in diet.    Patient Active Problem List  Diagnosis  . Diverticulosis of colon    Past Medical History  Diagnosis Date  . Diverticulitis of colon with perforation 08/22/2012    No past surgical history on file.  History   Social History  . Marital Status: Single    Spouse Name: N/A    Number of Children: N/A  . Years of Education: N/A   Occupational History  . Not on file.   Social History Main Topics  . Smoking status: Never Smoker   . Smokeless tobacco: Not on file  . Alcohol Use: No  . Drug Use: No  . Sexually Active:    Other Topics Concern  . Not on file   Social History Narrative  . No narrative on file    No family history on file.  Current Outpatient Prescriptions  Medication Sig Dispense Refill  . ibuprofen (ADVIL,MOTRIN) 200 MG tablet Take 200 mg  by mouth every 6 (six) hours as needed. For pain      . ciprofloxacin (CIPRO) 500 MG tablet Take 1 tablet (500 mg total) by mouth 2 (two) times daily.  20 tablet  2  . metroNIDAZOLE (FLAGYL) 500 MG tablet Take 1 tablet (500 mg total) by mouth 3 (three) times daily.  30 tablet  0     No Known Allergies  BP 128/84  Pulse 80  Temp 98.4 F (36.9 C) (Temporal)  Resp 18  Ht 5\' 5"  (1.651 m)  Wt 214 lb 12.8 oz (97.433 kg)  BMI 35.74 kg/m2  No results found.   Review of Systems  Constitutional: Negative for fever, chills, diaphoresis, appetite change and fatigue.  HENT: Negative for ear pain, sore throat, trouble swallowing, neck pain and ear discharge.   Eyes: Negative for photophobia, discharge and visual disturbance.  Respiratory: Negative for cough, choking, chest tightness and shortness of breath.   Cardiovascular: Negative for chest pain and palpitations.  Gastrointestinal: Negative for nausea, vomiting, abdominal pain, diarrhea, constipation, anal bleeding and rectal pain.  Genitourinary: Negative for dysuria, frequency and difficulty urinating.  Musculoskeletal: Negative for myalgias and gait problem.  Skin: Negative for color change, pallor and rash.  Neurological: Negative for dizziness, speech difficulty, weakness and numbness.  Hematological: Negative for adenopathy.  Psychiatric/Behavioral: Negative for confusion and agitation. The patient is not nervous/anxious.  Objective:   Physical Exam  Constitutional: She is oriented to person, place, and time. She appears well-developed and well-nourished. No distress.  HENT:  Head: Normocephalic.  Mouth/Throat: Oropharynx is clear and moist. No oropharyngeal exudate.  Eyes: Conjunctivae normal and EOM are normal. Pupils are equal, round, and reactive to light. No scleral icterus.  Neck: Normal range of motion. No tracheal deviation present.  Cardiovascular: Normal rate and intact distal pulses.   Pulmonary/Chest: Effort  normal. No respiratory distress. She exhibits no tenderness.  Abdominal: Soft. She exhibits no distension. There is no tenderness. Hernia confirmed negative in the right inguinal area and confirmed negative in the left inguinal area.       Incisions clean with normal healing ridges.  No hernias  Genitourinary: No vaginal discharge found.  Musculoskeletal: Normal range of motion. She exhibits no tenderness.  Lymphadenopathy:       Right: No inguinal adenopathy present.       Left: No inguinal adenopathy present.  Neurological: She is alert and oriented to person, place, and time. No cranial nerve deficit. She exhibits normal muscle tone. Coordination normal.  Skin: Skin is warm and dry. No rash noted. She is not diaphoretic.  Psychiatric: She has a normal mood and affect. Her behavior is normal.   *RADIOLOGY REPORT*  Clinical Data: 1-week history of left lower quadrant abdominal  pain, nausea and vomiting, and diarrhea.  CT ABDOMEN AND PELVIS WITH CONTRAST  Technique: Multidetector CT imaging of the abdomen and pelvis was  performed following the standard protocol during bolus  administration of intravenous contrast.  Contrast: OMNIPAQUE IOHEXOL 300 MG/ML. Oral contrast was also  administered.  Comparison: None.  Findings: Wall thickening involving the distal sigmoid colon where  there is a diverticulum. There is associated pericolonic  edema/inflammation. Extraluminal gas is present, though there is  no associated fluid collection. The remainder the colon is normal  in appearance.  Stomach decompressed and unremarkable by CT. Wall thickening  involving the terminal ileum over an approximate 6 cm segment.  Remainder of the small bowel normal in appearance. Normal appendix  in the right upper pelvis. No ascites.  Normal appearing liver, spleen, pancreas, adrenal glands, and  kidneys. Focus of accessory splenic tissue medial to the lower  pole of the spleen. No visible  aorto-iliofemoral atherosclerosis.  No significant lymphadenopathy. Gallbladder unremarkable by CT.  No biliary ductal dilation.  Uterus and ovaries normal in appearance. No free pelvic fluid.  Urinary bladder unremarkable. Bone window images demonstrate lower  thoracic spondylosis and degenerative changes involving the lower  lumbar facet joints. Visualized lung bases clear.  IMPRESSION:  1. Acute diverticulitis involving the distal sigmoid colon. Small  amount of extraluminal gas consistent with a microperforation. No  associated abscess.  2. Wall thickening over approximate 6 cm segment of the terminal  ileum. This could be infectious or inflammatory (Crohn's?)  Original Report Authenticated By: Hulan Saas, M.D.     Assessment:     Sigmoid colon microperforation consistent with diverticulitis.  It improved with antibiotics.  Not complicated.  Terminal ileal thickening seen incidentally on CT scan.  Family history of Crohn's disease.  No symptoms strongly suggestive for Crohn's, nonetheless...    Plan:     I think he would be a good idea to consider a colonoscopy to make sure there is no persistent or abnormal pathology in the sigmoid colon and evaluate the terminal ileum.  She is interested in pursuing this.  We will work to set  this up with a gastroenterologist.  High fiber diet to help prevent further attacks of diverticulitis.  This is the only attack she said her head was not particularly complicated.  I do not feel strongly that she requires a segmental colonic resection to prevent further attacks.  She feels reassured.  Increase activity as tolerated to regular activity.  Do not push through pain.  Return to clinic p.r.n.   Instructions discussed.  Followup with primary care physician for other health issues as would normally be done.  Questions answered.  The patient expressed understanding and appreciation

## 2012-08-22 NOTE — Patient Instructions (Addendum)
Consider colonoscopy to evaluate perforated sigmoid colon (probable attack of diverticulitis) as well as thickening of your distal small intestine/ileum to rule out Crohn's disease.  Diverticulitis A diverticulum is a small pouch or sac on the colon. Diverticulosis is the presence of these diverticula on the colon. Diverticulitis is the irritation (inflammation) or infection of diverticula. CAUSES  The colon and its diverticula contain bacteria. If food particles block the tiny opening to a diverticulum, the bacteria inside can grow and cause an increase in pressure. This leads to infection and inflammation and is called diverticulitis. SYMPTOMS   Abdominal pain and tenderness. Usually, the pain is located on the left side of your abdomen. However, it could be located elsewhere.  Fever.  Bloating.  Feeling sick to your stomach (nausea).  Throwing up (vomiting).  Abnormal stools. DIAGNOSIS  Your caregiver will take a history and perform a physical exam. Since many things can cause abdominal pain, other tests may be necessary. Tests may include:  Blood tests.  Urine tests.  X-ray of the abdomen.  CT scan of the abdomen. Sometimes, surgery is needed to determine if diverticulitis or other conditions are causing your symptoms. TREATMENT  Most of the time, you can be treated without surgery. Treatment includes:  Resting the bowels by only having liquids for a few days. As you improve, you will need to eat a low-fiber diet.  Intravenous (IV) fluids if you are losing body fluids (dehydrated).  Antibiotic medicines that treat infections may be given.  Pain and nausea medicine, if needed.  Surgery if the inflamed diverticulum has burst. HOME CARE INSTRUCTIONS   Try a clear liquid diet (broth, tea, or water for as long as directed by your caregiver). You may then gradually begin a low-fiber diet as tolerated. A low-fiber diet is a diet with less than 10 grams of fiber. Choose the  foods below to reduce fiber in the diet:  White breads, cereals, rice, and pasta.  Cooked fruits and vegetables or soft fresh fruits and vegetables without the skin.  Ground or well-cooked tender beef, ham, veal, lamb, pork, or poultry.  Eggs and seafood.  After your diverticulitis symptoms have improved, your caregiver may put you on a high-fiber diet. A high-fiber diet includes 14 grams of fiber for every 1000 calories consumed. For a standard 2000 calorie diet, you would need 28 grams of fiber. Follow these diet guidelines to help you increase the fiber in your diet. It is important to slowly increase the amount fiber in your diet to avoid gas, constipation, and bloating.  Choose whole-grain breads, cereals, pasta, and brown rice.  Choose fresh fruits and vegetables with the skin on. Do not overcook vegetables because the more vegetables are cooked, the more fiber is lost.  Choose more nuts, seeds, legumes, dried peas, beans, and lentils.  Look for food products that have greater than 3 grams of fiber per serving on the Nutrition Facts label.  Take all medicine as directed by your caregiver.  If your caregiver has given you a follow-up appointment, it is very important that you go. Not going could result in lasting (chronic) or permanent injury, pain, and disability. If there is any problem keeping the appointment, call to reschedule. SEEK MEDICAL CARE IF:   Your pain does not improve.  You have a hard time advancing your diet beyond clear liquids.  Your bowel movements do not return to normal. SEEK IMMEDIATE MEDICAL CARE IF:   Your pain becomes worse.  You have an  oral temperature above 102 F (38.9 C), not controlled by medicine.  You have repeated vomiting.  You have bloody or black, tarry stools.  Symptoms that brought you to your caregiver become worse or are not getting better. MAKE SURE YOU:   Understand these instructions.  Will watch your condition.  Will  get help right away if you are not doing well or get worse. Document Released: 06/09/2005 Document Revised: 11/22/2011 Document Reviewed: 10/05/2010 [facility] Patient Information 2013 [place], [place].  Diverticulosis Diverticulosis is a common condition that develops when small pouches (diverticula) form in the wall of the colon. The risk of diverticulosis increases with age. It happens more often in people who eat a low-fiber diet. Most individuals with diverticulosis have no symptoms. Those individuals with symptoms usually experience abdominal pain, constipation, or loose stools (diarrhea). HOME CARE INSTRUCTIONS   Increase the amount of fiber in your diet as directed by your caregiver or dietician. This may reduce symptoms of diverticulosis.  Your caregiver may recommend taking a dietary fiber supplement.  Drink at least 6 to 8 glasses of water each day to prevent constipation.  Try not to strain when you have a bowel movement.  Your caregiver may recommend avoiding nuts and seeds to prevent complications, although this is still an uncertain benefit.  Only take over-the-counter or prescription medicines for pain, discomfort, or fever as directed by your caregiver. FOODS WITH HIGH FIBER CONTENT INCLUDE:  Fruits. Apple, peach, pear, tangerine, raisins, prunes.  Vegetables. Brussels sprouts, asparagus, broccoli, cabbage, carrot, cauliflower, romaine lettuce, spinach, summer squash, tomato, winter squash, zucchini.  Starchy Vegetables. Baked beans, kidney beans, lima beans, split peas, lentils, potatoes (with skin).  Grains. Whole wheat bread, brown rice, bran flake cereal, plain oatmeal, white rice, shredded wheat, bran muffins. SEEK IMMEDIATE MEDICAL CARE IF:   You develop increasing pain or severe bloating.  You have an oral temperature above 102 F (38.9 C), not controlled by medicine.  You develop vomiting or bowel movements that are bloody or black. Document Released:  05/27/2004 Document Revised: 11/22/2011 Document Reviewed: 01/28/2010 [facility] Patient Information 2013 [place], [place].  GETTING TO GOOD BOWEL HEALTH. Irregular bowel habits such as constipation and diarrhea can lead to many problems over time.  Having one soft bowel movement a day is the most important way to prevent further problems.  The anorectal canal is designed to handle stretching and feces to safely manage our ability to get rid of solid waste (feces, poop, stool) out of our body.  BUT, hard constipated stools can act like ripping concrete bricks and diarrhea can be a burning fire to this very sensitive area of our body, causing inflamed hemorrhoids, anal fissures, increasing risk is perirectal abscesses, abdominal pain/bloating, an making irritable bowel worse.     The goal: ONE SOFT BOWEL MOVEMENT A DAY!  To have soft, regular bowel movements:    Drink at least 8 tall glasses of water a day.     Take plenty of fiber.  Fiber is the undigested part of plant food that passes into the colon, acting s "natures broom" to encourage bowel motility and movement.  Fiber can absorb and hold large amounts of water. This results in a larger, bulkier stool, which is soft and easier to pass. Work gradually over several weeks up to 6 servings a day of fiber (25g a day even more if needed) in the form of: o Vegetables -- Root (potatoes, carrots, turnips), leafy Alf (lettuce, salad greens, celery, spinach), or cooked high  residue (cabbage, broccoli, etc) o Fruit -- Fresh (unpeeled skin & pulp), Dried (prunes, apricots, cherries, etc ),  or stewed ( applesauce)  o Whole grain breads, pasta, etc (whole wheat)  o Bran cereals    Bulking Agents -- This type of water-retaining fiber generally is easily obtained each day by one of the following:  o Psyllium bran -- The psyllium plant is remarkable because its ground seeds can retain so much water. This product is available as Metamucil, Konsyl, Effersyllium, Per  Diem Fiber, or the less expensive generic preparation in drug and health food stores. Although labeled a laxative, it really is not a laxative.  o Methylcellulose -- This is another fiber derived from wood which also retains water. It is available as Citrucel. o Polyethylene Glycol - and "artificial" fiber commonly called Miralax or Glycolax.  It is helpful for people with gassy or bloated feelings with regular fiber o Flax Seed - a less gassy fiber than psyllium   No reading or other relaxing activity while on the toilet. If bowel movements take longer than 5 minutes, you are too constipated   AVOID CONSTIPATION.  High fiber and water intake usually takes care of this.  Sometimes a laxative is needed to stimulate more frequent bowel movements, but    Laxatives are not a good long-term solution as it can wear the colon out. o Osmotics (Milk of Magnesia, Fleets phosphosoda, Magnesium citrate, MiraLax, GoLytely) are safer than  o Stimulants (Senokot, Castor Oil, Dulcolax, Ex Lax)    o Do not take laxatives for more than 7days in a row.    IF SEVERELY CONSTIPATED, try a Bowel Retraining Program: o Do not use laxatives.  o Eat a diet high in roughage, such as bran cereals and leafy vegetables.  o Drink six (6) ounces of prune or apricot juice each morning.  o Eat two (2) large servings of stewed fruit each day.  o Take one (1) heaping tablespoon of a psyllium-based bulking agent twice a day. Use sugar-free sweetener when possible to avoid excessive calories.  o Eat a normal breakfast.  o Set aside 15 minutes after breakfast to sit on the toilet, but do not strain to have a bowel movement.  o If you do not have a bowel movement by the third day, use an enema and repeat the above steps.    Controlling diarrhea o Switch to liquids and simpler foods for a few days to avoid stressing your intestines further. o Avoid dairy products (especially milk & ice cream) for a short time.  The intestines often can  lose the ability to digest lactose when stressed. o Avoid foods that cause gassiness or bloating.  Typical foods include beans and other legumes, cabbage, broccoli, and dairy foods.  Every person has some sensitivity to other foods, so listen to our body and avoid those foods that trigger problems for you. o Adding fiber (Citrucel, Metamucil, psyllium, Miralax) gradually can help thicken stools by absorbing excess fluid and retrain the intestines to act more normally.  Slowly increase the dose over a few weeks.  Too much fiber too soon can backfire and cause cramping & bloating. o Probiotics (such as active yogurt, Align, etc) may help repopulate the intestines and colon with normal bacteria and calm down a sensitive digestive tract.  Most studies show it to be of mild help, though, and such products can be costly. o Medicines:   Bismuth subsalicylate (ex. Kayopectate, Pepto Bismol) every 30 minutes for up  to 6 doses can help control diarrhea.  Avoid if pregnant.   Loperamide (Immodium) can slow down diarrhea.  Start with two tablets (4mg  total) first and then try one tablet every 6 hours.  Avoid if you are having fevers or severe pain.  If you are not better or start feeling worse, stop all medicines and call your doctor for advice o Call your doctor if you are getting worse or not better.  Sometimes further testing (cultures, endoscopy, X-ray studies, bloodwork, etc) may be needed to help diagnose and treat the cause of the diarrhea. o

## 2012-08-23 ENCOUNTER — Telehealth (INDEPENDENT_AMBULATORY_CARE_PROVIDER_SITE_OTHER): Payer: Self-pay | Admitting: General Surgery

## 2012-08-23 NOTE — Telephone Encounter (Signed)
Patient made aware of appt and given number in case she needs to reschedule.

## 2012-08-23 NOTE — Telephone Encounter (Signed)
Left message on machine for patient to call back and ask for me. To make patient aware I have referred her to [facility] for an appt with Dr Jarold Motto 09/15/2011 @ 9:00 am to arrive at 8:45 am.

## 2012-09-05 ENCOUNTER — Encounter: Payer: Self-pay | Admitting: *Deleted

## 2012-09-14 ENCOUNTER — Ambulatory Visit (INDEPENDENT_AMBULATORY_CARE_PROVIDER_SITE_OTHER): Payer: BC Managed Care – PPO | Admitting: Gastroenterology

## 2012-09-14 ENCOUNTER — Encounter: Payer: Self-pay | Admitting: Gastroenterology

## 2012-09-14 VITALS — BP 108/80 | HR 90 | Ht 63.75 in | Wt 213.0 lb

## 2012-09-14 DIAGNOSIS — K5720 Diverticulitis of large intestine with perforation and abscess without bleeding: Secondary | ICD-10-CM

## 2012-09-14 DIAGNOSIS — K5732 Diverticulitis of large intestine without perforation or abscess without bleeding: Secondary | ICD-10-CM

## 2012-09-14 MED ORDER — MOVIPREP 100 G PO SOLR: 1.0000 | Freq: Once | ORAL | Status: DC

## 2012-09-14 NOTE — Patient Instructions (Addendum)
You have been scheduled for a colonoscopy with propofol on 09-18-2012. Please follow written instructions given to you at your visit today.  Please pick up your prep kit at the pharmacy within the next 1-3 days. If you use inhalers (even only as needed) or a CPAP machine, please bring them with you on the day of your procedure.  You watched a video today on Diverticulosis/Diverticulitis.  _______________________________________________________________________________________________________________________  Lisa Tyler have been scheduled for a CT scan of the abdomen and pelvis at [facility] ([address]---this is in the same building as Architectural technologist).   You are scheduled on 09-15-2012 at  2 PM. You should arrive 15 minutes prior to your appointment time for registration. Please follow the written instructions below on the day of your exam:  WARNING: IF YOU ARE ALLERGIC TO IODINE/X-RAY DYE, PLEASE NOTIFY RADIOLOGY IMMEDIATELY AT [phone]! YOU WILL BE GIVEN A 13 HOUR PREMEDICATION PREP.  1) Do not eat or drink anything after (4 hours prior to your test) 2) You have been given 2 bottles of oral contrast to drink. The solution may taste better if refrigerated, but do NOT add ice or any other liquid to this solution. Shake well before drinking.    Drink 1 bottle of contrast @ 12 PM (2 hours prior to your exam)  Drink 1 bottle of contrast @ 1 PM(1 hour prior to your exam)  You may take any medications as prescribed with a small amount of water except for the following: Metformin, Glucophage, Glucovance, Avandamet, Riomet, Fortamet, Actoplus Met, Janumet, Glumetza or Metaglip. The above medications must be held the day of the exam AND 48 hours after the exam.  The purpose of you drinking the oral contrast is to aid in the visualization of your intestinal tract. The contrast solution may cause some diarrhea. Before your exam is started, you will be given a small amount of fluid to  drink. Depending on your individual set of symptoms, you may also receive an intravenous injection of x-ray contrast/dye. Plan on being at [facility] for 30 minutes or long, depending on the type of exam you are having performed.  This test typically takes 30-45 minutes to complete.  If you have any questions regarding your exam or if you need to reschedule, you may call the CT department at [phone] between the hours of 8:00 am and 5:00 pm, Monday-Friday.  _________________________________________________________________________________________________________________  Diverticulitis A diverticulum is a small pouch or sac on the colon. Diverticulosis is the presence of these diverticula on the colon. Diverticulitis is the irritation (inflammation) or infection of diverticula. CAUSES  The colon and its diverticula contain bacteria. If food particles block the tiny opening to a diverticulum, the bacteria inside can grow and cause an increase in pressure. This leads to infection and inflammation and is called diverticulitis. SYMPTOMS   Abdominal pain and tenderness. Usually, the pain is located on the left side of your abdomen. However, it could be located elsewhere.  Fever.  Bloating.  Feeling sick to your stomach (nausea).  Throwing up (vomiting).  Abnormal stools. DIAGNOSIS  Your caregiver will take a history and perform a physical exam. Since many things can cause abdominal pain, other tests may be necessary. Tests may include:  Blood tests.  Urine tests.  X-ray of the abdomen.  CT scan of the abdomen. Sometimes, surgery is needed to determine if diverticulitis or other conditions are causing your symptoms. TREATMENT  Most of the time, you can be treated without surgery. Treatment  includes:  Resting the bowels by only having liquids for a few days. As you improve, you will need to eat a low-fiber diet.  Intravenous (IV) fluids if you are losing body fluids  (dehydrated).  Antibiotic medicines that treat infections may be given.  Pain and nausea medicine, if needed.  Surgery if the inflamed diverticulum has burst. HOME CARE INSTRUCTIONS   Try a clear liquid diet (broth, tea, or water for as long as directed by your caregiver). You may then gradually begin a low-fiber diet as tolerated. A low-fiber diet is a diet with less than 10 grams of fiber. Choose the foods below to reduce fiber in the diet:  White breads, cereals, rice, and pasta.  Cooked fruits and vegetables or soft fresh fruits and vegetables without the skin.  Ground or well-cooked tender beef, ham, veal, lamb, pork, or poultry.  Eggs and seafood.  After your diverticulitis symptoms have improved, your caregiver may put you on a high-fiber diet. A high-fiber diet includes 14 grams of fiber for every 1000 calories consumed. For a standard 2000 calorie diet, you would need 28 grams of fiber. Follow these diet guidelines to help you increase the fiber in your diet. It is important to slowly increase the amount fiber in your diet to avoid gas, constipation, and bloating.  Choose whole-grain breads, cereals, pasta, and brown rice.  Choose fresh fruits and vegetables with the skin on. Do not overcook vegetables because the more vegetables are cooked, the more fiber is lost.  Choose more nuts, seeds, legumes, dried peas, beans, and lentils.  Look for food products that have greater than 3 grams of fiber per serving on the Nutrition Facts label.  Take all medicine as directed by your caregiver.  If your caregiver has given you a follow-up appointment, it is very important that you go. Not going could result in lasting (chronic) or permanent injury, pain, and disability. If there is any problem keeping the appointment, call to reschedule. SEEK MEDICAL CARE IF:   Your pain does not improve.  You have a hard time advancing your diet beyond clear liquids.  Your bowel movements do not  return to normal. SEEK IMMEDIATE MEDICAL CARE IF:   Your pain becomes worse.  You have an oral temperature above 102 F (38.9 C), not controlled by medicine.  You have repeated vomiting.  You have bloody or black, tarry stools.  Symptoms that brought you to your caregiver become worse or are not getting better. MAKE SURE YOU:   Understand these instructions.  Will watch your condition.  Will get help right away if you are not doing well or get worse. Document Released: 06/09/2005 Document Revised: 11/22/2011 Document Reviewed: 10/05/2010 [facility] Patient Information 2013 [place], [place].

## 2012-09-14 NOTE — Progress Notes (Addendum)
History of Present Illness:  This is a 47 yo pt. With recent diverticulitis in early November..treated with several weeks or antibiotics..now asymptomayic,++FH colon CA in father and 1st cousin with Crohn's disease.She had a viral GI ilness before her hospitalization.  This patient was entirely asymptomatic before her illness has suffered one week viral syndrome with rather severe nonbloody diarrhea.  No other patient home is sick, and she had not been on antibiotics.  Patient had previous endoscopy with Dr. Huston Foley in 2005 because of acid reflux.  She uses when necessary Prevacid currently.  Patient is on a low fiber diet is having no gastrointestinal or general medical problems.  CT scan reviewed showed diverticulitis and also possible thickening of the terminal ileum.  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  Again she denies any other sick family members at home.  She is followed by Dr. Rudi Heap in her surgeon is Dr. Karie Soda  I have reviewed this patient's present history, medical and surgical past history, allergies and medications.     ROS: The remainder of the 10 point ROS is negative     Physical Exam: Blood pressure 108/80, pulse 90 and regular, and weight 213 pounds with a BMI of 36.85. General well developed well nourished patient in no acute distress, appearing their stated age Eyes PERRLA, no icterus, fundoscopic exam per opthamologist Skin no lesions noted Neck supple, no adenopathy, no thyroid enlargement, no tenderness Chest clear to percussion and auscultation Heart no significant murmurs, gallops or rubs noted Abdomen no hepatosplenomegaly masses or tenderness, BS normal.  Rectal inspection normal no fissures, or fistulae noted.  No masses or tenderness on digital exam. Stool guaiac negative. Extremities no acute joint lesions, edema, phlebitis or evidence of cellulitis. Neurologic patient oriented x 3, cranial nerves intact, no focal neurologic  deficits noted. Psychological mental status normal and normal affect.  Assessment and plan:Resolved diverticulitis,r/o underlying IBD,Will schedule colonoscopy,info on diverticulitis provided.,,,  I have reviewed diverticulosis and diverticulitis with her in detail, and we will in to do fiber into her diet as tolerated.  Risk and benefits of colonoscopy also were reviewed.  Encounter Diagnosis  Name Primary?  . Diverticulitis of colon with perforation Yes

## 2012-09-15 ENCOUNTER — Ambulatory Visit (INDEPENDENT_AMBULATORY_CARE_PROVIDER_SITE_OTHER)
Admission: RE | Admit: 2012-09-15 | Discharge: 2012-09-15 | Disposition: A | Discharge status: Home / Self Care | Payer: BC Managed Care – PPO | Source: Ambulatory Visit | Attending: Gastroenterology | Admitting: Gastroenterology

## 2012-09-15 DIAGNOSIS — K5732 Diverticulitis of large intestine without perforation or abscess without bleeding: Secondary | ICD-10-CM

## 2012-09-15 DIAGNOSIS — K5720 Diverticulitis of large intestine with perforation and abscess without bleeding: Secondary | ICD-10-CM

## 2012-09-15 MED ORDER — IOHEXOL 300 MG/ML  SOLN
100.0000 mL | Freq: Once | INTRAMUSCULAR | Status: AC | PRN
  Administered 2012-09-15: 100 mL via INTRAVENOUS

## 2012-09-18 ENCOUNTER — Ambulatory Visit (AMBULATORY_SURGERY_CENTER): Payer: BC Managed Care – PPO | Admitting: Gastroenterology

## 2012-09-18 ENCOUNTER — Encounter: Payer: Self-pay | Admitting: Gastroenterology

## 2012-09-18 VITALS — BP 129/98 | HR 63 | Temp 99.1°F | Resp 20 | Ht 63.0 in | Wt 213.0 lb

## 2012-09-18 DIAGNOSIS — K5730 Diverticulosis of large intestine without perforation or abscess without bleeding: Secondary | ICD-10-CM

## 2012-09-18 DIAGNOSIS — K5732 Diverticulitis of large intestine without perforation or abscess without bleeding: Secondary | ICD-10-CM

## 2012-09-18 DIAGNOSIS — Z800 Family history of malignant neoplasm of digestive organs: Secondary | ICD-10-CM

## 2012-09-18 MED ORDER — SODIUM CHLORIDE 0.9 % IV SOLN: 500.0000 mL | INTRAVENOUS | Status: DC

## 2012-09-18 NOTE — Patient Instructions (Addendum)
Findings:  Normal, a few Diverticuli in the sigmoid colon. Recommendations:  High fiber diet, Repeat colonoscopy in 5 years.  YOU HAD AN ENDOSCOPIC PROCEDURE TODAY AT THE [facility]: Refer to the procedure report that was given to you for any specific questions about what was found during the examination.  If the procedure report does not answer your questions, please call your gastroenterologist to clarify.  If you requested that your care partner not be given the details of your procedure findings, then the procedure report has been included in a sealed envelope for you to review at your convenience later.  YOU SHOULD EXPECT: Some feelings of bloating in the abdomen. Passage of more gas than usual.  Walking can help get rid of the air that was put into your GI tract during the procedure and reduce the bloating. If you had a lower endoscopy (such as a colonoscopy or flexible sigmoidoscopy) you may notice spotting of blood in your stool or on the toilet paper. If you underwent a bowel prep for your procedure, then you may not have a normal bowel movement for a few days.  DIET: Your first meal following the procedure should be a light meal and then it is ok to progress to your normal diet.  A half-sandwich or bowl of soup is an example of a good first meal.  Heavy or fried foods are harder to digest and may make you feel nauseous or bloated.  Likewise meals heavy in dairy and vegetables can cause extra gas to form and this can also increase the bloating.  Drink plenty of fluids but you should avoid alcoholic beverages for 24 hours.  ACTIVITY: Your care partner should take you home directly after the procedure.  You should plan to take it easy, moving slowly for the rest of the day.  You can resume normal activity the day after the procedure however you should NOT DRIVE or use heavy machinery for 24 hours (because of the sedation medicines used during the test).    SYMPTOMS TO REPORT  IMMEDIATELY: A gastroenterologist can be reached at any hour.  During normal business hours, 8:30 AM to 5:00 PM Monday through Friday, call [phone].  After hours and on weekends, please call the GI answering service at [phone] who will take a message and have the physician on call contact you.   Following lower endoscopy (colonoscopy or flexible sigmoidoscopy):  Excessive amounts of blood in the stool  Significant tenderness or worsening of abdominal pains  Swelling of the abdomen that is new, acute  Fever of 100F or higher  Following upper endoscopy (EGD)  Vomiting of blood or coffee ground material  New chest pain or pain under the shoulder blades  Painful or persistently difficult swallowing  New shortness of breath  Fever of 100F or higher  Black, tarry-looking stools  FOLLOW UP: If any biopsies were taken you will be contacted by phone or by letter within the next 1-3 weeks.  Call your gastroenterologist if you have not heard about the biopsies in 3 weeks.  Our staff will call the home number listed on your records the next business day following your procedure to check on you and address any questions or concerns that you may have at that time regarding the information given to you following your procedure. This is a courtesy call and so if there is no answer at the home number and we have not heard from you through the emergency physician on  call, we will assume that you have returned to your regular daily activities without incident.  SIGNATURES/CONFIDENTIALITY: You and/or your care partner have signed paperwork which will be entered into your electronic medical record.  These signatures attest to the fact that that the information above on your After Visit Summary has been reviewed and is understood.  Full responsibility of the confidentiality of this discharge information lies with you and/or your care-partner.   Please follow all discharge instructions given to you  by the recovery room nurse. If you have any questions or problems after discharge please call one of the numbers listed above. You will receive a phone call in the am to see how you are doing and answer any questions you may have. Thank you for choosing [facility] for your health care needs.

## 2012-09-18 NOTE — Progress Notes (Signed)
Patient did not experience any of the following events: a burn prior to discharge; a fall within the facility; wrong site/side/patient/procedure/implant event; or a hospital transfer or hospital admission upon discharge from the facility. (G8907) Patient did not have preoperative order for IV antibiotic SSI prophylaxis. (G8918)  

## 2012-09-18 NOTE — Op Note (Signed)
[facility] [address] [place], [address]   COLONOSCOPY PROCEDURE REPORT  PATIENT: Lisa Tyler, Lisa Tyler  MR#: [id number] BIRTHDATE: [birth year] , 46  yrs. old GENDER: Female ENDOSCOPIST: Mardella Layman, MD, [facility] REFERRED BY: PROCEDURE DATE:  09/18/2012 PROCEDURE:   Colonoscopy, screening ASA CLASS:   Class II INDICATIONS:Patient's immediate family history of colon cancer. Recent acute diverticulitis. MEDICATIONS: propofol (Diprivan) 150mg  IV  DESCRIPTION OF PROCEDURE:   After the risks and benefits and of the procedure were explained, informed consent was obtained.  A digital rectal exam revealed no abnormalities of the rectum.    The LB [id number]  endoscope was introduced through the anus and advanced to the cecum, which was identified by both the appendix and ileocecal valve .  The quality of the prep was excellent, using MoviPrep .  The instrument was then slowly withdrawn as the colon was fully examined.     COLON FINDINGS: A normal appearing cecum, ileocecal valve, and appendiceal orifice were identified.  The ascending, hepatic flexure, transverse, splenic flexure, descending, sigmoid colon and rectum appeared unremarkable.  No polyps or cancers were seen. Scattered very small mouthed sigmoid diverticulae noted,no inflammation or colitis or bleeding.   Retroflexed views revealed no abnormalities.     The scope was then withdrawn from the patient and the procedure completed.  COMPLICATIONS: There were no complications. ENDOSCOPIC IMPRESSION: Normal colon ...scattered sigmoid tics noted,no inflammation,stricture,.etc.  RECOMMENDATIONS: 1.  High fiber diet with liberal fluid intake. 2.  Given your significant family history of colon cancer, you should have a repeat colonoscopy in 5 years   REPEAT EXAM:  cc:  _______________________________ eSignedMardella Layman, MD, [facility] 09/18/2012 10:27 AM

## 2012-09-19 ENCOUNTER — Telehealth: Payer: Self-pay | Admitting: *Deleted

## 2012-09-19 NOTE — Telephone Encounter (Signed)
  Follow up Call-  Call back number 09/18/2012  Post procedure Call Back phone  # [phone]  Permission to leave phone message Yes     Patient questions:  Do you have a fever, pain , or abdominal swelling? no Pain Score  0 *  Have you tolerated food without any problems? yes  Have you been able to return to your normal activities? yes  Do you have any questions about your discharge instructions: Diet   no Medications  no Follow up visit  no  Do you have questions or concerns about your Care? no  Actions: * If pain score is 4 or above: No action needed, pain <4.

## 2013-01-22 IMAGING — CT CT ABD-PELV W/ CM
2 of 5 series · 17 of 46 positions shown, 19 images · IV contrast (Omnipaque 300)
Comparison: 07/16/2012

CLINICAL DATA: Colonic diverticulosis with perforation. Recent
hospitalization.  Possible terminal ileitis seen on recent CT.

CT ABDOMEN AND PELVIS WITH CONTRAST
TECHNIQUE: Multidetector CT imaging of the abdomen and pelvis was
performed following the standard protocol during bolus
administration of intravenous contrast.
Contrast: 100mL OMNIPAQUE IOHEXOL 300 MG/ML  SOLN

[Series 2: abd/ pel 5mm · axial · 0.78mm/px · z∈[-445,-25]mm · 14 of 94 slices shown, 16 images]
[im 5/94  soft-tissue]
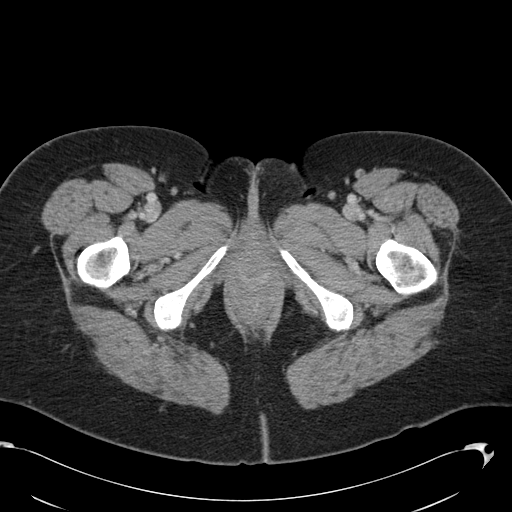
[im 5/94  bone]
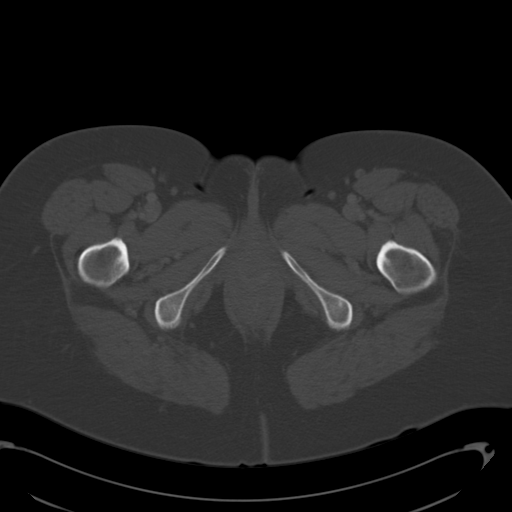
[im 14/94  soft-tissue]
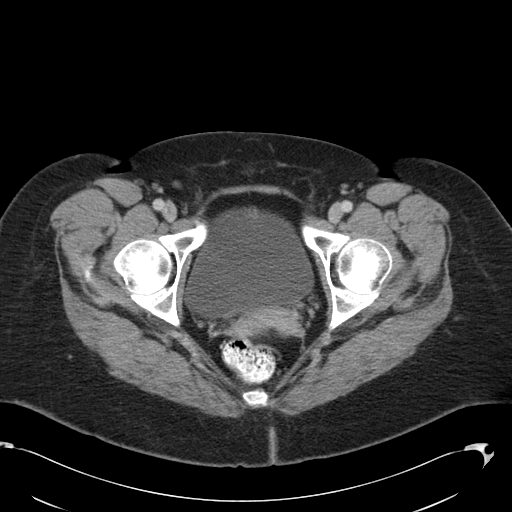
[im 19/94  soft-tissue]
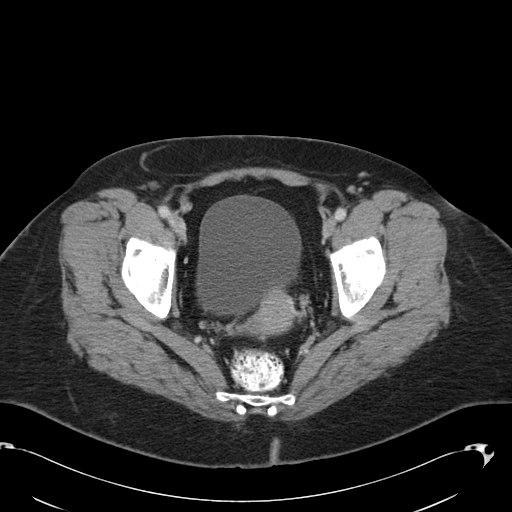
[im 24/94  soft-tissue]
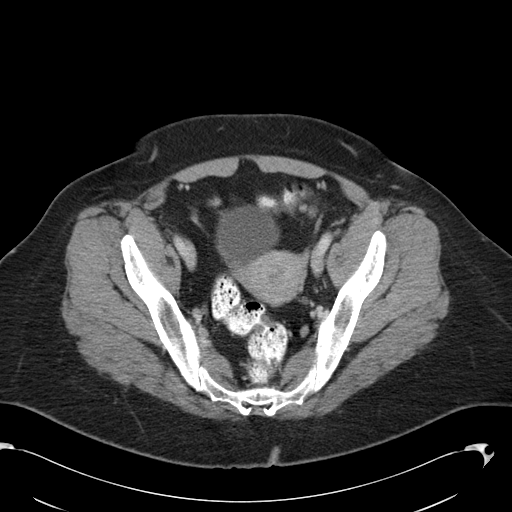
[im 33/94  soft-tissue]
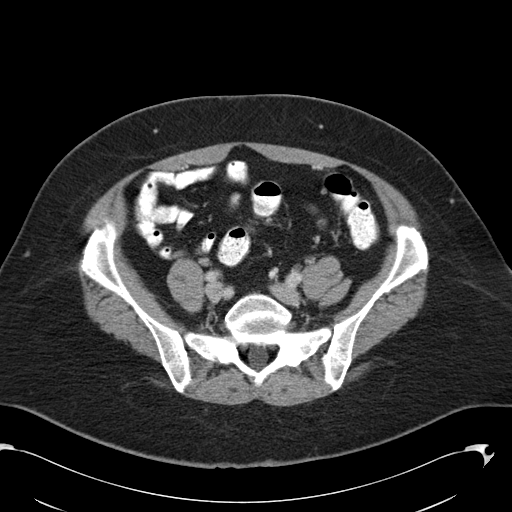
[im 38/94  soft-tissue]
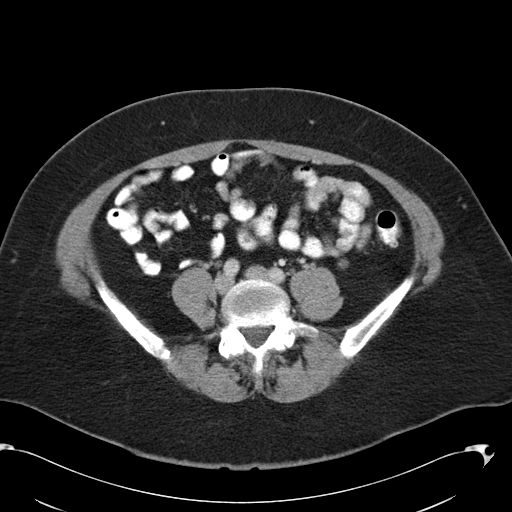
[im 42/94  soft-tissue]
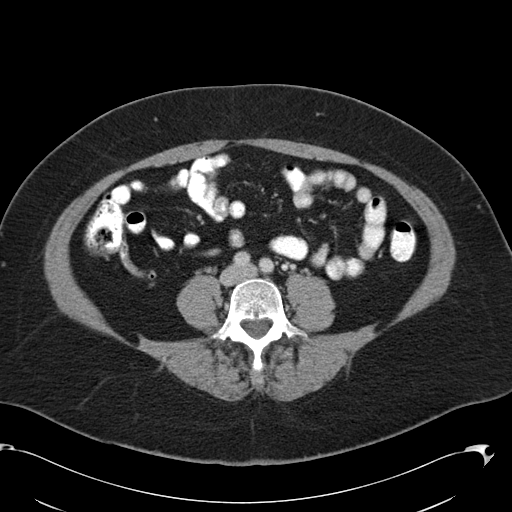
[im 52/94  soft-tissue]
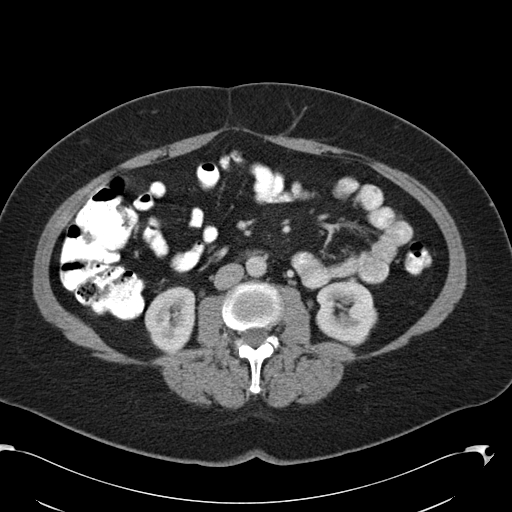
[im 56/94  soft-tissue]
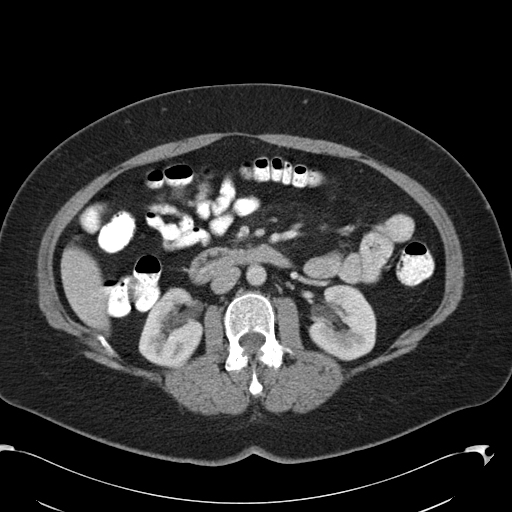
[im 56/94  bone]
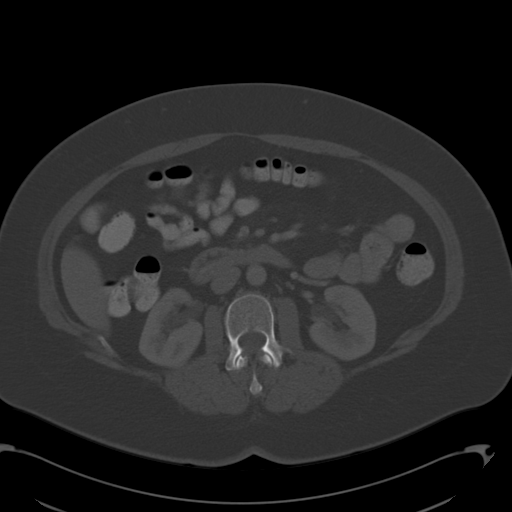
[im 61/94  soft-tissue]
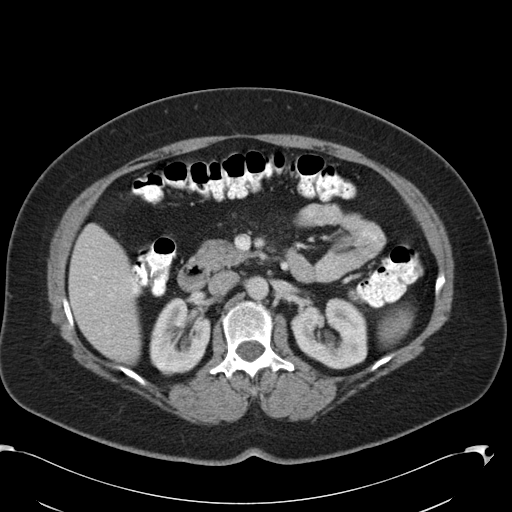
[im 70/94  soft-tissue]
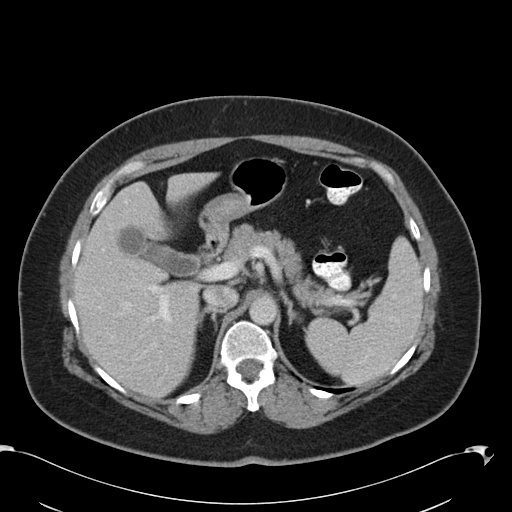
[im 75/94  soft-tissue]
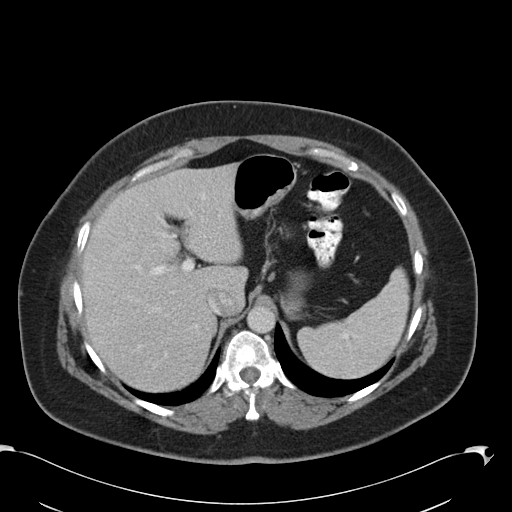
[im 80/94  soft-tissue]
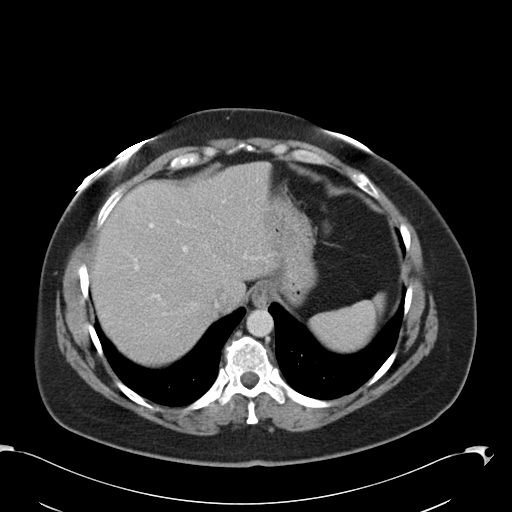
[im 89/94  soft-tissue]
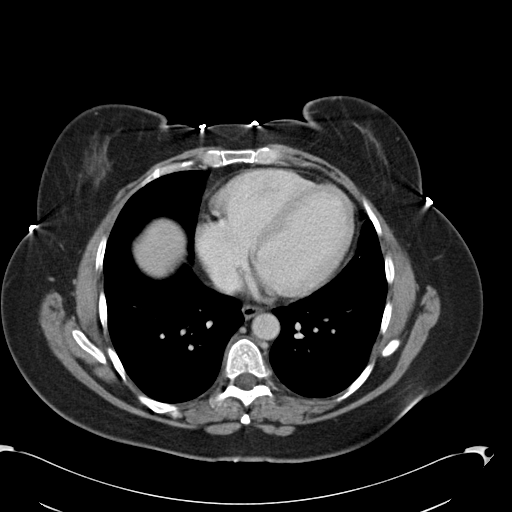

[Series 602: cor · coronal · 0.94mm/px · 3 of 109 slices shown]
[im 37/109  soft-tissue]
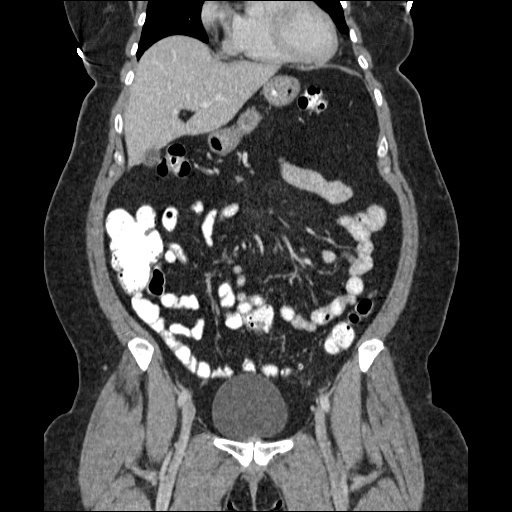
[im 49/109  soft-tissue]
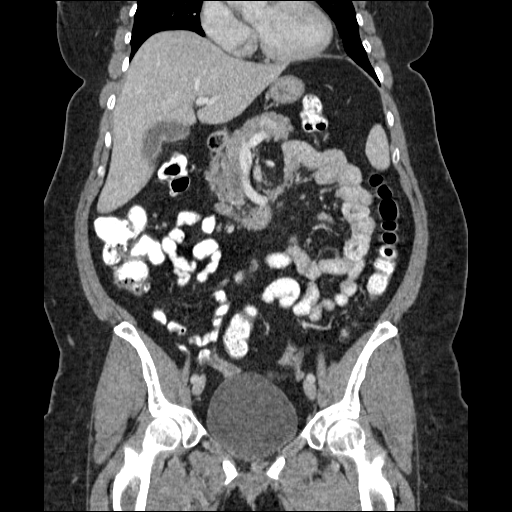
[im 61/109  soft-tissue]
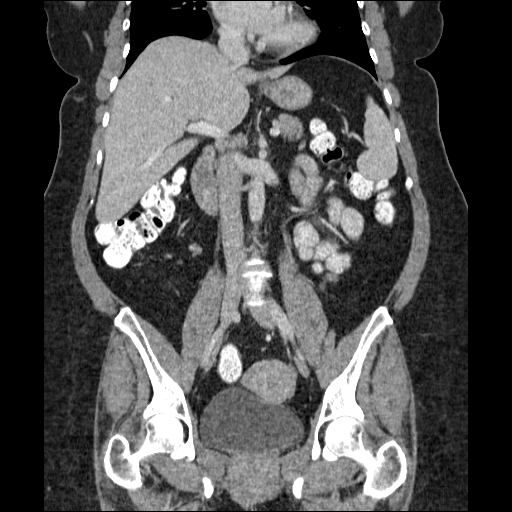

[17 of 46 positions shown; findings below may reference images not displayed]

FINDINGS: The abdominal parenchymal organs are normal in
appearance.  Gallbladder is unremarkable.  No evidence of
hydronephrosis.  No soft tissue masses or lymphadenopathy
identified within the abdomen or pelvis.  Uterus and adnexal
regions are unremarkable in appearance.

Sigmoid diverticulosis is again demonstrated, however there is been
interval resolution of diverticulitis since prior exam.  No
evidence of abscess, free fluid, or extraluminal gas collections.
Previously seen mild wall thickening involving the terminal ileum
is also resolved.  Normal appendix is visualized.  No evidence of
dilated bowel loops or hernia.
IMPRESSION: 1.  Resolution of sigmoid diverticulitis and mild wall thickening
involving terminal ileum since prior study.
2.  No acute findings or other significant abnormality identified.

## 2013-01-23 ENCOUNTER — Ambulatory Visit (INDEPENDENT_AMBULATORY_CARE_PROVIDER_SITE_OTHER): Payer: BC Managed Care – PPO | Admitting: Surgery

## 2013-01-23 ENCOUNTER — Encounter (INDEPENDENT_AMBULATORY_CARE_PROVIDER_SITE_OTHER): Payer: Self-pay | Admitting: Surgery

## 2013-01-23 VITALS — BP 122/86 | HR 76 | Temp 97.0°F | Resp 16 | Ht 64.0 in | Wt 211.0 lb

## 2013-01-23 DIAGNOSIS — R748 Abnormal levels of other serum enzymes: Secondary | ICD-10-CM

## 2013-01-23 DIAGNOSIS — K8010 Calculus of gallbladder with chronic cholecystitis without obstruction: Secondary | ICD-10-CM | POA: Insufficient documentation

## 2013-01-23 DIAGNOSIS — E669 Obesity, unspecified: Secondary | ICD-10-CM

## 2013-01-23 NOTE — Patient Instructions (Addendum)
See the Handout(s) we gave you.  Consider surgery.  Please call our office at [phone] if you wish to schedule surgery or if you have further questions / concerns.   Cholecystitis Cholecystitis is an inflammation of your gallbladder. It is usually caused by a buildup of gallstones or sludge (cholelithiasis) in your gallbladder. The gallbladder stores a fluid that helps digest fats (bile). Cholecystitis is serious and needs treatment right away.  CAUSES   Gallstones. Gallstones can block the tube that leads to your gallbladder, causing bile to build up. As bile builds up, the gallbladder becomes inflamed.  Bile duct problems, such as blockage from scarring or kinking.  Tumors. Tumors can stop bile from leaving your gallbladder correctly, causing bile to build up. As bile builds up, the gallbladder becomes inflamed. SYMPTOMS   Nausea.  Vomiting.  Abdominal pain, especially in the upper right area of your abdomen.  Abdominal tenderness or bloating.  Sweating.  Chills.  Fever.  Yellowing of the skin and the whites of the eyes (jaundice). DIAGNOSIS  Your caregiver may order blood tests to look for infection or gallbladder problems. Your caregiver may also order imaging tests, such as an ultrasound or computed tomography (CT) scan. Further tests may include a hepatobiliary iminodiacetic acid (HIDA) scan. This scan allows your caregiver to see your bile move from the liver to the gallbladder and to the small intestine. TREATMENT  A hospital stay is usually necessary to lessen the inflammation of your gallbladder. You may be required to not eat or drink (fast) for a certain amount of time. You may be given medicine to treat pain or an antibiotic medicine to treat an infection. Surgery may be needed to remove your gallbladder (cholecystectomy) once the inflammation has gone down. Surgery may be needed right away if you develop complications such as death of gallbladder tissue (gangrene)  or a tear (perforation) of the gallbladder.  HOME CARE INSTRUCTIONS  Home care will depend on your treatment. In general:  If you were given antibiotics, take them as directed. Finish them even if you start to feel better.  Only take over-the-counter or prescription medicines for pain, discomfort, or fever as directed by your caregiver.  Follow a low-fat diet until you see your caregiver again.  Keep all follow-up visits as directed by your caregiver. SEEK IMMEDIATE MEDICAL CARE IF:   Your pain is increasing and not controlled by medicines.  Your pain moves to another part of your abdomen or to your back.  You have a fever.  You have nausea and vomiting. MAKE SURE YOU:  Understand these instructions.  Will watch your condition.  Will get help right away if you are not doing well or get worse. Document Released: 08/30/2005 Document Revised: 11/22/2011 Document Reviewed: 07/16/2011 [facility] Patient Information 2013 [place], [place].  LAPAROSCOPIC SURGERY: POST OP INSTRUCTIONS  1. DIET: Follow a light bland diet the first 24 hours after arrival home, such as soup, liquids, crackers, etc.  Be sure to include lots of fluids daily.  Avoid fast food or heavy meals as your are more likely to get nauseated.  Eat a low fat the next few days after surgery.   2. Take your usually prescribed home medications unless otherwise directed. 3. PAIN CONTROL: a. Pain is best controlled by a usual combination of three different methods TOGETHER: i. Ice/Heat ii. Over the counter pain medication iii. Prescription pain medication b. Most patients will experience some swelling and bruising around the incisions.  Ice packs or  heating pads (30-60 minutes up to 6 times a day) will help. Use ice for the first few days to help decrease swelling and bruising, then switch to heat to help relax tight/sore spots and speed recovery.  Some people prefer to use ice alone, heat alone, alternating between ice & heat.   Experiment to what works for you.  Swelling and bruising can take several weeks to resolve.   c. It is helpful to take an over-the-counter pain medication regularly for the first few weeks.  Choose one of the following that works best for you: i. Naproxen (Aleve, etc)  Two 220mg  tabs twice a day ii. Ibuprofen (Advil, etc) Three 200mg  tabs four times a day (every meal & bedtime) iii. Acetaminophen (Tylenol, etc) 500-650mg  four times a day (every meal & bedtime) d. A  prescription for pain medication (such as oxycodone, hydrocodone, etc) should be given to you upon discharge.  Take your pain medication as prescribed.  i. If you are having problems/concerns with the prescription medicine (does not control pain, nausea, vomiting, rash, itching, etc), please call us [phone] to see if we need to switch you to a different pain medicine that will work better for you and/or control your side effect better. ii. If you need a refill on your pain medication, please contact your pharmacy.  They will contact our office to request authorization. Prescriptions will not be filled after 5 pm or on week-ends. 4. Avoid getting constipated.  Between the surgery and the pain medications, it is common to experience some constipation.  Increasing fluid intake and taking a fiber supplement (such as Metamucil, Citrucel, FiberCon, MiraLax, etc) 1-2 times a day regularly will usually help prevent this problem from occurring.  A mild laxative (prune juice, Milk of Magnesia, MiraLax, etc) should be taken according to package directions if there are no bowel movements after 48 hours.   5. Watch out for diarrhea.  If you have many loose bowel movements, simplify your diet to bland foods & liquids for a few days.  Stop any stool softeners and decrease your fiber supplement.  Switching to mild anti-diarrheal medications (Kayopectate, Pepto Bismol) can help.  If this worsens or does not improve, please call us. 6. Wash / shower every  day.  You may shower over the dressings as they are waterproof.  Continue to shower over incision(s) after the dressing is off. 7. Remove your waterproof bandages 5 days after surgery.  You may leave the incision open to air.  You may replace a dressing/Band-Aid to cover the incision for comfort if you wish.  8. ACTIVITIES as tolerated:   a. You may resume regular (light) daily activities beginning the next day-such as daily self-care, walking, climbing stairs-gradually increasing activities as tolerated.  If you can walk 30 minutes without difficulty, it is safe to try more intense activity such as jogging, treadmill, bicycling, low-impact aerobics, swimming, etc. b. Save the most intensive and strenuous activity for last such as sit-ups, heavy lifting, contact sports, etc  Refrain from any heavy lifting or straining until you are off narcotics for pain control.   c. DO NOT PUSH THROUGH PAIN.  Let pain be your guide: If it hurts to do something, don't do it.  Pain is your body warning you to avoid that activity for another week until the pain goes down. d. You may drive when you are no longer taking prescription pain medication, you can comfortably wear a seatbelt, and you can safely maneuver your  car and apply brakes. e. Bonita Quin may have sexual intercourse when it is comfortable.  9. FOLLOW UP in our office a. Please call [facility] at [phone] to set up an appointment to see your surgeon in the office for a follow-up appointment approximately 2-3 weeks after your surgery. b. Make sure that you call for this appointment the day you arrive home to insure a convenient appointment time. 10. IF YOU HAVE DISABILITY OR FAMILY LEAVE FORMS, BRING THEM TO THE OFFICE FOR PROCESSING.  DO NOT GIVE THEM TO YOUR DOCTOR.   WHEN TO CALL us [phone]: 1. Poor pain control 2. Reactions / problems with new medications (rash/itching, nausea, etc)  3. Fever over 101.5 F (38.5 C) 4. Inability to urinate 5. Nausea  and/or vomiting 6. Worsening swelling or bruising 7. Continued bleeding from incision. 8. Increased pain, redness, or drainage from the incision   The clinic staff is available to answer your questions during regular business hours (8:30am-5pm).  Please don't hesitate to call and ask to speak to one of our nurses for clinical concerns.   If you have a medical emergency, go to the nearest emergency room or call 911.  A surgeon from [facility] is always on call at the [facility], [place] [address], [address], [place], [place]  [address] ? MAIN: [phone] ? TOLL FREE: [phone] ?  FAX [phone] [email or web address]  Diverticulosis Diverticulosis is a common condition that develops when small pouches (diverticula) form in the wall of the colon. The risk of diverticulosis increases with age. It happens more often in people who eat a low-fiber diet. Most individuals with diverticulosis have no symptoms. Those individuals with symptoms usually experience abdominal pain, constipation, or loose stools (diarrhea). HOME CARE INSTRUCTIONS   Increase the amount of fiber in your diet as directed by your caregiver or dietician. This may reduce symptoms of diverticulosis.  Your caregiver may recommend taking a dietary fiber supplement.  Drink at least 6 to 8 glasses of water each day to prevent constipation.  Try not to strain when you have a bowel movement.  Your caregiver may recommend avoiding nuts and seeds to prevent complications, although this is still an uncertain benefit.  Only take over-the-counter or prescription medicines for pain, discomfort, or fever as directed by your caregiver. FOODS WITH HIGH FIBER CONTENT INCLUDE:  Fruits. Apple, peach, pear, tangerine, raisins, prunes.  Vegetables. Brussels sprouts, asparagus, broccoli, cabbage, carrot, cauliflower, romaine lettuce, spinach, summer squash, tomato, winter  squash, zucchini.  Starchy Vegetables. Baked beans, kidney beans, lima beans, split peas, lentils, potatoes (with skin).  Grains. Whole wheat bread, brown rice, bran flake cereal, plain oatmeal, white rice, shredded wheat, bran muffins. SEEK IMMEDIATE MEDICAL CARE IF:   You develop increasing pain or severe bloating.  You have an oral temperature above 102 F (38.9 C), not controlled by medicine.  You develop vomiting or bowel movements that are bloody or black. Document Released: 05/27/2004 Document Revised: 11/22/2011 Document Reviewed: 01/28/2010 [facility] Patient Information 2013 [place], [place].  GETTING TO GOOD BOWEL HEALTH. Irregular bowel habits such as constipation and diarrhea can lead to many problems over time.  Having one soft bowel movement a day is the most important way to prevent further problems.  The anorectal canal is designed to handle stretching and feces to safely manage our ability to get rid of solid waste (feces, poop, stool) out of our body.  BUT, hard constipated stools can act like  ripping concrete bricks and diarrhea can be a burning fire to this very sensitive area of our body, causing inflamed hemorrhoids, anal fissures, increasing risk is perirectal abscesses, abdominal pain/bloating, an making irritable bowel worse.     The goal: ONE SOFT BOWEL MOVEMENT A DAY!  To have soft, regular bowel movements:    Drink at least 8 tall glasses of water a day.     Take plenty of fiber.  Fiber is the undigested part of plant food that passes into the colon, acting s "natures broom" to encourage bowel motility and movement.  Fiber can absorb and hold large amounts of water. This results in a larger, bulkier stool, which is soft and easier to pass. Work gradually over several weeks up to 6 servings a day of fiber (25g a day even more if needed) in the form of: o Vegetables -- Root (potatoes, carrots, turnips), leafy Friedl (lettuce, salad greens, celery, spinach), or cooked high  residue (cabbage, broccoli, etc) o Fruit -- Fresh (unpeeled skin & pulp), Dried (prunes, apricots, cherries, etc ),  or stewed ( applesauce)  o Whole grain breads, pasta, etc (whole wheat)  o Bran cereals    Bulking Agents -- This type of water-retaining fiber generally is easily obtained each day by one of the following:  o Psyllium bran -- The psyllium plant is remarkable because its ground seeds can retain so much water. This product is available as Metamucil, Konsyl, Effersyllium, Per Diem Fiber, or the less expensive generic preparation in drug and health food stores. Although labeled a laxative, it really is not a laxative.  o Methylcellulose -- This is another fiber derived from wood which also retains water. It is available as Citrucel. o Polyethylene Glycol - and "artificial" fiber commonly called Miralax or Glycolax.  It is helpful for people with gassy or bloated feelings with regular fiber o Flax Seed - a less gassy fiber than psyllium   No reading or other relaxing activity while on the toilet. If bowel movements take longer than 5 minutes, you are too constipated   AVOID CONSTIPATION.  High fiber and water intake usually takes care of this.  Sometimes a laxative is needed to stimulate more frequent bowel movements, but    Laxatives are not a good long-term solution as it can wear the colon out. o Osmotics (Milk of Magnesia, Fleets phosphosoda, Magnesium citrate, MiraLax, GoLytely) are safer than  o Stimulants (Senokot, Castor Oil, Dulcolax, Ex Lax)    o Do not take laxatives for more than 7days in a row.    IF SEVERELY CONSTIPATED, try a Bowel Retraining Program: o Do not use laxatives.  o Eat a diet high in roughage, such as bran cereals and leafy vegetables.  o Drink six (6) ounces of prune or apricot juice each morning.  o Eat two (2) large servings of stewed fruit each day.  o Take one (1) heaping tablespoon of a psyllium-based bulking agent twice a day. Use sugar-free sweetener  when possible to avoid excessive calories.  o Eat a normal breakfast.  o Set aside 15 minutes after breakfast to sit on the toilet, but do not strain to have a bowel movement.  o If you do not have a bowel movement by the third day, use an enema and repeat the above steps.    Controlling diarrhea o Switch to liquids and simpler foods for a few days to avoid stressing your intestines further. o Avoid dairy products (especially milk & ice cream)  for a short time.  The intestines often can lose the ability to digest lactose when stressed. o Avoid foods that cause gassiness or bloating.  Typical foods include beans and other legumes, cabbage, broccoli, and dairy foods.  Every person has some sensitivity to other foods, so listen to our body and avoid those foods that trigger problems for you. o Adding fiber (Citrucel, Metamucil, psyllium, Miralax) gradually can help thicken stools by absorbing excess fluid and retrain the intestines to act more normally.  Slowly increase the dose over a few weeks.  Too much fiber too soon can backfire and cause cramping & bloating. o Probiotics (such as active yogurt, Align, etc) may help repopulate the intestines and colon with normal bacteria and calm down a sensitive digestive tract.  Most studies show it to be of mild help, though, and such products can be costly. o Medicines:   Bismuth subsalicylate (ex. Kayopectate, Pepto Bismol) every 30 minutes for up to 6 doses can help control diarrhea.  Avoid if pregnant.   Loperamide (Immodium) can slow down diarrhea.  Start with two tablets (4mg  total) first and then try one tablet every 6 hours.  Avoid if you are having fevers or severe pain.  If you are not better or start feeling worse, stop all medicines and call your doctor for advice o Call your doctor if you are getting worse or not better.  Sometimes further testing (cultures, endoscopy, X-ray studies, bloodwork, etc) may be needed to help diagnose and treat the cause  of the diarrhea. o

## 2013-01-23 NOTE — Progress Notes (Signed)
Subjective:     Patient ID: Lisa Tyler, female   DOB: 02/14/1966, 47 y.o.   MRN: [id number]  HPI  Lisa Tyler  1966-04-20 [id number]  Patient Care Team: Ernestina Penna, MD as PCP - General (Family Medicine) Marva Panda as Nurse Practitioner  This patient is a 47 y.o.female who presents today for surgical evaluation at the request of Marva Panda.   Reason for visit: Abdominal pain.  Question gallbladder etiology  Pleasant obese female.Had an episode of diverticulitis.  Choose antibiotics.  On the episode.  Some thickening of her ileum is well.  Followup CAT scan showed resolution of everything.  Colonoscopy done by Dr. Jarold Motto notes diverticulosis only.  Terminal ileum normal.  Patient now comes to me with a new issue.  To begin get episodes of substernal chest pain/pressure.  She wondered if she had recurrent reflux.  She saw her primary care physician.  She was restarted on protonix.  She thinks has helped some on her reflux but she has still gotten attacks.  She is just her diet.  Avoiding spicy or acid foods.  Trying to transition to a low-fat diet.  However, she has poor tolerance to proteins and raw foods.  Still having discomfort on protonic.  She has had episodes of nausea and vomiting with this.  Often radiates to her back.  Sometimes belching helps.  She has tried Pepcid during the attacks and it does not help.  Worst episode usually happens an hour to after breakfast.  She has been taking a fiber supplement and has a bowel movement twice a day.  No sick contacts at contacts or travel history.  She can walk an hour without difficulty.  No worsening shortness of breath or pressure.  No radiation to her jaw or arm.  This does not feel like reflux.Ultrasound noted stones and sludge.  Therefore, she was sent to me for surgical evaluation to see if the gallbladder is the etiology of her pain  Patient Active Problem List   Diagnosis Date Noted  . Elevated alkaline  phosphatase level 01/23/2013  . Diverticulosis of colon 08/22/2012  . Terminal ileal thickening on CT [id number], ?Crohn's? 08/22/2012  . Obesity (BMI 30-39.9) 08/22/2012    Past Medical History  Diagnosis Date  . Diverticulitis of colon with perforation 08/22/2012    History reviewed. No pertinent past surgical history.  History   Social History  . Marital Status: Married    Spouse Name: N/A    Number of Children: 3  . Years of Education: N/A   Occupational History  . Educational psychologist     self employed   Social History Main Topics  . Smoking status: Never Smoker   . Smokeless tobacco: Never Used  . Alcohol Use: No  . Drug Use: No  . Sexually Active: Not on file   Other Topics Concern  . Not on file   Social History Narrative  . No narrative on file    Family History  Problem Relation Age of Onset  . Crohn's disease Cousin     Maternal cousin  . Breast cancer Maternal Aunt   . Colon cancer Father   . Diabetes Maternal Grandmother   . Colitis Maternal Grandmother     Current Outpatient Prescriptions  Medication Sig Dispense Refill  . pantoprazole (PROTONIX) 20 MG tablet Take 20 mg by mouth daily. Pt unsure of actual dosage      . ibuprofen (ADVIL,MOTRIN) 200 MG tablet Take 200 mg by mouth  every 6 (six) hours as needed. For pain       No current facility-administered medications for this visit.     No Known Allergies  BP 122/86  Pulse 76  Temp(Src) 97 F (36.1 C) (Temporal)  Resp 16  Ht 5\' 4"  (1.626 m)  Wt 211 lb (95.709 kg)  BMI 36.2 kg/m2  No results found.   Review of Systems  Constitutional: Negative for fever, chills, diaphoresis, appetite change and fatigue.  HENT: Negative for ear pain, sore throat, trouble swallowing, neck pain and ear discharge.   Eyes: Negative for photophobia, discharge and visual disturbance.  Respiratory: Positive for chest tightness. Negative for apnea, cough, choking, shortness of breath, wheezing and stridor.     Cardiovascular: Positive for chest pain. Negative for palpitations and leg swelling.  Gastrointestinal: Positive for nausea, vomiting and abdominal pain. Negative for diarrhea, constipation, anal bleeding and rectal pain.  Genitourinary: Negative for dysuria, frequency and difficulty urinating.  Musculoskeletal: Positive for back pain. Negative for myalgias and gait problem.  Skin: Negative for color change, pallor and rash.  Neurological: Negative for dizziness, speech difficulty, weakness and numbness.  Hematological: Negative for adenopathy.  Psychiatric/Behavioral: Negative for confusion and agitation. The patient is not nervous/anxious.        Objective:   Physical Exam  Constitutional: She is oriented to person, place, and time. She appears well-developed and well-nourished. No distress.  HENT:  Head: Normocephalic.  Mouth/Throat: Oropharynx is clear and moist. No oropharyngeal exudate.  Eyes: Conjunctivae and EOM are normal. Pupils are equal, round, and reactive to light. No scleral icterus.  Neck: Normal range of motion. Neck supple. No tracheal deviation present.  Cardiovascular: Normal rate, regular rhythm and intact distal pulses.   Pulmonary/Chest: Effort normal and breath sounds normal. No respiratory distress. She exhibits no tenderness.  Abdominal: Soft. She exhibits no distension and no mass. There is tenderness in the right upper quadrant. There is no rigidity, no rebound, no guarding, no CVA tenderness, no tenderness at McBurney's point and negative Murphy's sign. No hernia. Hernia confirmed negative in the right inguinal area and confirmed negative in the left inguinal area.    Genitourinary: No vaginal discharge found.  Musculoskeletal: Normal range of motion. She exhibits no tenderness.  Lymphadenopathy:    She has no cervical adenopathy.       Right: No inguinal adenopathy present.       Left: No inguinal adenopathy present.  Neurological: She is alert and  oriented to person, place, and time. No cranial nerve deficit. She exhibits normal muscle tone. Coordination normal.  Skin: Skin is warm and dry. No rash noted. She is not diaphoretic. No erythema.  Psychiatric: She has a normal mood and affect. Her behavior is normal. Judgment and thought content normal.       Assessment:     Postprandial chest/epigastric pain radiating to the back.  Refractory to proton pump inhibitors.  Increased liver function tests on outside labs.  Gallstones/sludge.  Suspicious for biliary etiology     Plan:     At this point, I offered options.  Given the fact she has abnormal liver tests and is tender over her gallbladder, I suspect she would benefit from cholecystectomy.  I cannot rule in or anything else in the differential diagnosis.  Would like to get a chest x-ray and EKG preop anyway.  I did offer for her to see gastroenterology to see if upper endoscopy or further workup is needed, but I am skeptical that  it will have much to offer.  She is more inclined to be aggressive and proceed with cholecystectomy given the lack of improvement on Protonix and other etiologies ruled out:  The anatomy & physiology of hepatobiliary & pancreatic function was discussed.  The pathophysiology of gallbladder dysfunction was discussed.  Natural history risks without surgery was discussed.   I feel the risks of no intervention will lead to serious problems that outweigh the operative risks; therefore, I recommended cholecystectomy to remove the pathology.  I explained laparoscopic techniques with possible need for an open approach.  Probable cholangiogram to evaluate the bilary tract was explained as well.    Risks such as bleeding, infection, abscess, leak, injury to other organs, need for further treatment, heart attack, death, and other risks were discussed.  I noted a good likelihood this will help address the problem.  Possibility that this will not correct all abdominal symptoms was  explained.  Goals of post-operative recovery were discussed as well.  We will work to minimize complications.  An educational handout further explaining the pathology and treatment options was given as well.  Questions were answered.  The patient expresses understanding & wishes to proceed with surgery.

## 2013-01-24 ENCOUNTER — Ambulatory Visit (HOSPITAL_COMMUNITY)
Admission: RE | Admit: 2013-01-24 | Discharge: 2013-01-24 | Disposition: A | Discharge status: Home / Self Care | Payer: BC Managed Care – PPO | Source: Ambulatory Visit | Attending: Surgery | Admitting: Surgery

## 2013-01-24 ENCOUNTER — Encounter (HOSPITAL_COMMUNITY): Payer: Self-pay

## 2013-01-24 ENCOUNTER — Encounter (HOSPITAL_COMMUNITY)
Admission: RE | Admit: 2013-01-24 | Discharge: 2013-01-24 | Disposition: A | Discharge status: Home / Self Care | Payer: BC Managed Care – PPO | Source: Ambulatory Visit | Attending: Surgery | Admitting: Surgery

## 2013-01-24 ENCOUNTER — Encounter (HOSPITAL_COMMUNITY): Payer: Self-pay | Admitting: Pharmacy Technician

## 2013-01-24 DIAGNOSIS — R748 Abnormal levels of other serum enzymes: Secondary | ICD-10-CM

## 2013-01-24 DIAGNOSIS — K8010 Calculus of gallbladder with chronic cholecystitis without obstruction: Secondary | ICD-10-CM | POA: Insufficient documentation

## 2013-01-24 DIAGNOSIS — Z01818 Encounter for other preprocedural examination: Secondary | ICD-10-CM | POA: Insufficient documentation

## 2013-01-24 DIAGNOSIS — Z01810 Encounter for preprocedural cardiovascular examination: Secondary | ICD-10-CM | POA: Insufficient documentation

## 2013-01-24 DIAGNOSIS — Z01812 Encounter for preprocedural laboratory examination: Secondary | ICD-10-CM | POA: Insufficient documentation

## 2013-01-24 HISTORY — DX: Abnormal levels of other serum enzymes: R74.8

## 2013-01-24 HISTORY — PX: OTHER SURGICAL HISTORY: SHX169

## 2013-01-24 HISTORY — DX: Gastro-esophageal reflux disease without esophagitis: K21.9

## 2013-01-24 LAB — CBC
HCT: 41.5 % (ref 36.0–46.0)
MCH: 29.7 pg (ref 26.0–34.0)
MCHC: 33.7 g/dL (ref 30.0–36.0)
MCV: 88.1 fL (ref 78.0–100.0)
Platelets: 210 10*3/uL (ref 150–400)
RDW: 12.3 % (ref 11.5–15.5)
WBC: 4.5 10*3/uL (ref 4.0–10.5)

## 2013-01-24 LAB — SURGICAL PCR SCREEN: MRSA, PCR: NEGATIVE

## 2013-01-24 LAB — COMPREHENSIVE METABOLIC PANEL
AST: 24 U/L (ref 0–37)
Albumin: 3.8 g/dL (ref 3.5–5.2)
BUN: 13 mg/dL (ref 6–23)
Calcium: 9.7 mg/dL (ref 8.4–10.5)
Creatinine, Ser: 0.82 mg/dL (ref 0.50–1.10)

## 2013-01-24 NOTE — Progress Notes (Signed)
01-24-13 Do you desire pt. To have an antibiotic for surgery day?

## 2013-01-24 NOTE — Patient Instructions (Addendum)
20 YUNIQUE DEARCOS  01/24/2013   Your procedure is scheduled on: 5-15  -2014  Report to [facility] at        1230 PM.  Call this number if you have problems the morning of surgery: [phone]  Or Presurgical Testing [phone]([place])      Do not eat food:After Midnight.  May have clear liquids:up to 6 Hours before arrival. Nothing after : 0900 AM  Clear liquids include soda, tea, black coffee, apple or grape juice, broth.  Take these medicines the morning of surgery with A SIP OF WATER: Pantoprazole. No Aspirin, or herbal products x5 days prior.   Do not wear jewelry, make-up or nail polish.  Do not wear lotions, powders, or perfumes. You may wear deodorant.  Do not shave 12 hours prior to first CHG shower(legs and under arms).(face and neck okay.)  Do not bring valuables to the hospital.  Contacts, dentures or bridgework,body piercing,  may not be worn into surgery.  Leave suitcase in the car. After surgery it may be brought to your room.  For patients admitted to the hospital, checkout time is 11:00 AM the day of discharge.   Patients discharged the day of surgery will not be allowed to drive home. Must have responsible person with you x 24 hours once discharged.  Name and phone number of your driver: son Madelin Rear [phone] cell  Special Instructions: CHG(Chlorhedine 4%-"Hibiclens","Betasept","Aplicare") Shower Use Special Wash: see special instructions.(avoid face and genitals)   Please read over the following fact sheets that you were given: MRSA Information.   Failure to follow these instructions may result in Cancellation of your surgery.   Patient signature_______________________________________________________

## 2013-01-24 NOTE — Pre-Procedure Instructions (Signed)
01-24-13 EKG/ CXR done today per MD order.

## 2013-01-25 ENCOUNTER — Ambulatory Visit (HOSPITAL_COMMUNITY)
Admission: RE | Admit: 2013-01-25 | Discharge: 2013-01-25 | Disposition: A | Discharge status: Home / Self Care | Payer: BC Managed Care – PPO | Source: Ambulatory Visit | Attending: Surgery | Admitting: Surgery

## 2013-01-25 ENCOUNTER — Encounter (HOSPITAL_COMMUNITY): Payer: Self-pay | Admitting: *Deleted

## 2013-01-25 ENCOUNTER — Ambulatory Visit (HOSPITAL_COMMUNITY): Payer: BC Managed Care – PPO

## 2013-01-25 ENCOUNTER — Encounter (HOSPITAL_COMMUNITY)
Admission: RE | Disposition: A | Discharge status: Home / Self Care | Payer: Self-pay | Source: Ambulatory Visit | Attending: Surgery

## 2013-01-25 ENCOUNTER — Ambulatory Visit (HOSPITAL_COMMUNITY): Payer: BC Managed Care – PPO | Admitting: *Deleted

## 2013-01-25 DIAGNOSIS — Z79899 Other long term (current) drug therapy: Secondary | ICD-10-CM | POA: Insufficient documentation

## 2013-01-25 DIAGNOSIS — K8010 Calculus of gallbladder with chronic cholecystitis without obstruction: Secondary | ICD-10-CM | POA: Insufficient documentation

## 2013-01-25 DIAGNOSIS — Z6836 Body mass index (BMI) 36.0-36.9, adult: Secondary | ICD-10-CM | POA: Insufficient documentation

## 2013-01-25 DIAGNOSIS — K219 Gastro-esophageal reflux disease without esophagitis: Secondary | ICD-10-CM | POA: Insufficient documentation

## 2013-01-25 DIAGNOSIS — E669 Obesity, unspecified: Secondary | ICD-10-CM | POA: Insufficient documentation

## 2013-01-25 DIAGNOSIS — K8000 Calculus of gallbladder with acute cholecystitis without obstruction: Secondary | ICD-10-CM | POA: Insufficient documentation

## 2013-01-25 HISTORY — PX: INTRAOPERATIVE CHOLANGIOGRAM: SHX5230

## 2013-01-25 HISTORY — PX: LAPAROSCOPIC CHOLECYSTECTOMY SINGLE PORT: SHX5891

## 2013-01-25 SURGERY — LAPAROSCOPIC CHOLECYSTECTOMY SINGLE SITE
Anesthesia: General | Site: Abdomen | Wound class: Clean Contaminated

## 2013-01-25 MED ORDER — FENTANYL CITRATE 0.05 MG/ML IJ SOLN
25.0000 ug | INTRAMUSCULAR | Status: DC | PRN
  Administered 2013-01-25 (×3): 50 ug via INTRAVENOUS

## 2013-01-25 MED ORDER — PROPOFOL 10 MG/ML IV BOLUS
INTRAVENOUS | Status: DC | PRN
  Administered 2013-01-25: 2000 mg via INTRAVENOUS

## 2013-01-25 MED ORDER — DEXAMETHASONE SODIUM PHOSPHATE 10 MG/ML IJ SOLN
INTRAMUSCULAR | Status: DC | PRN
  Administered 2013-01-25: 5 mg via INTRAVENOUS

## 2013-01-25 MED ORDER — FENTANYL CITRATE 0.05 MG/ML IJ SOLN
INTRAMUSCULAR | Status: AC
  Filled 2013-01-25: qty 2

## 2013-01-25 MED ORDER — CHLORHEXIDINE GLUCONATE 4 % EX LIQD: 1.0000 "application " | Freq: Once | CUTANEOUS | Status: DC

## 2013-01-25 MED ORDER — ROCURONIUM BROMIDE 100 MG/10ML IV SOLN
INTRAVENOUS | Status: DC | PRN
  Administered 2013-01-25: 50 mg via INTRAVENOUS

## 2013-01-25 MED ORDER — STERILE WATER FOR IRRIGATION IR SOLN
Status: DC | PRN
  Administered 2013-01-25: 1500 mL

## 2013-01-25 MED ORDER — ACETAMINOPHEN 325 MG PO TABS: 650.0000 mg | ORAL_TABLET | ORAL | Status: DC | PRN

## 2013-01-25 MED ORDER — LACTATED RINGERS IV SOLN
INTRAVENOUS | Status: DC | PRN
  Administered 2013-01-25: 13:00:00 via INTRAVENOUS

## 2013-01-25 MED ORDER — OXYCODONE HCL 5 MG PO TABS: 5.0000 mg | ORAL_TABLET | ORAL | Status: DC | PRN

## 2013-01-25 MED ORDER — BUPIVACAINE-EPINEPHRINE 0.25% -1:200000 IJ SOLN
INTRAMUSCULAR | Status: DC | PRN
  Administered 2013-01-25: 50 mL

## 2013-01-25 MED ORDER — 0.9 % SODIUM CHLORIDE (POUR BTL) OPTIME
TOPICAL | Status: DC | PRN
  Administered 2013-01-25: 1000 mL

## 2013-01-25 MED ORDER — BUPIVACAINE-EPINEPHRINE 0.25% -1:200000 IJ SOLN
INTRAMUSCULAR | Status: AC
  Filled 2013-01-25: qty 1

## 2013-01-25 MED ORDER — KETOROLAC TROMETHAMINE 30 MG/ML IJ SOLN
INTRAMUSCULAR | Status: DC | PRN
  Administered 2013-01-25: 30 mg via INTRAVENOUS

## 2013-01-25 MED ORDER — MEPERIDINE HCL 50 MG/ML IJ SOLN: 6.2500 mg | INTRAMUSCULAR | Status: DC | PRN

## 2013-01-25 MED ORDER — METOCLOPRAMIDE HCL 5 MG/ML IJ SOLN
INTRAMUSCULAR | Status: DC | PRN
  Administered 2013-01-25: 10 mg via INTRAVENOUS

## 2013-01-25 MED ORDER — LIDOCAINE HCL (CARDIAC) 20 MG/ML IV SOLN
INTRAVENOUS | Status: DC | PRN
  Administered 2013-01-25: 50 mg via INTRAVENOUS

## 2013-01-25 MED ORDER — PROMETHAZINE HCL 25 MG/ML IJ SOLN: 6.2500 mg | INTRAMUSCULAR | Status: DC | PRN

## 2013-01-25 MED ORDER — ACETAMINOPHEN 650 MG RE SUPP
650.0000 mg | RECTAL | Status: DC | PRN
  Filled 2013-01-25: qty 1

## 2013-01-25 MED ORDER — SODIUM CHLORIDE 0.9 % IJ SOLN: 3.0000 mL | INTRAMUSCULAR | Status: DC | PRN

## 2013-01-25 MED ORDER — MIDAZOLAM HCL 5 MG/5ML IJ SOLN
INTRAMUSCULAR | Status: DC | PRN
  Administered 2013-01-25: 2 mg via INTRAVENOUS

## 2013-01-25 MED ORDER — IOHEXOL 300 MG/ML  SOLN
INTRAMUSCULAR | Status: DC | PRN
  Administered 2013-01-25: 10 mL

## 2013-01-25 MED ORDER — SODIUM CHLORIDE 0.9 % IJ SOLN: 3.0000 mL | Freq: Two times a day (BID) | INTRAMUSCULAR | Status: DC

## 2013-01-25 MED ORDER — FENTANYL CITRATE 0.05 MG/ML IJ SOLN
INTRAMUSCULAR | Status: DC | PRN
  Administered 2013-01-25: 100 ug via INTRAVENOUS
  Administered 2013-01-25 (×3): 50 ug via INTRAVENOUS

## 2013-01-25 MED ORDER — ACETAMINOPHEN 10 MG/ML IV SOLN
INTRAVENOUS | Status: AC
  Filled 2013-01-25: qty 100

## 2013-01-25 MED ORDER — SODIUM CHLORIDE 0.9 % IV SOLN: 250.0000 mL | INTRAVENOUS | Status: DC | PRN

## 2013-01-25 MED ORDER — LACTATED RINGERS IV SOLN
INTRAVENOUS | Status: DC
  Administered 2013-01-25: 16:00:00 via INTRAVENOUS

## 2013-01-25 MED ORDER — GLYCOPYRROLATE 0.2 MG/ML IJ SOLN
INTRAMUSCULAR | Status: DC | PRN
  Administered 2013-01-25: 0.6 mg via INTRAVENOUS

## 2013-01-25 MED ORDER — PANTOPRAZOLE SODIUM 40 MG PO TBEC
40.0000 mg | DELAYED_RELEASE_TABLET | Freq: Every day | ORAL | Status: DC
  Administered 2013-01-25: 40 mg via ORAL
  Filled 2013-01-25 (×2): qty 1

## 2013-01-25 MED ORDER — NEOSTIGMINE METHYLSULFATE 1 MG/ML IJ SOLN
INTRAMUSCULAR | Status: DC | PRN
  Administered 2013-01-25: 4 mg via INTRAVENOUS

## 2013-01-25 MED ORDER — ONDANSETRON HCL 4 MG/2ML IJ SOLN
INTRAMUSCULAR | Status: DC | PRN
  Administered 2013-01-25: 4 mg via INTRAVENOUS

## 2013-01-25 MED ORDER — ACETAMINOPHEN 10 MG/ML IV SOLN
INTRAVENOUS | Status: DC | PRN
  Administered 2013-01-25: 1000 mg via INTRAVENOUS

## 2013-01-25 MED ORDER — IOHEXOL 300 MG/ML  SOLN
INTRAMUSCULAR | Status: AC
  Filled 2013-01-25: qty 1

## 2013-01-25 MED ORDER — ONDANSETRON HCL 4 MG/2ML IJ SOLN: 4.0000 mg | Freq: Four times a day (QID) | INTRAMUSCULAR | Status: DC | PRN

## 2013-01-25 MED ORDER — FENTANYL CITRATE 0.05 MG/ML IJ SOLN: 25.0000 ug | INTRAMUSCULAR | Status: DC | PRN

## 2013-01-25 SURGICAL SUPPLY — 47 items
APPLIER CLIP ROT 10 11.4 M/L (STAPLE)
APPLIER CLIP UNV 5X34 EPIX (ENDOMECHANICALS) ×2 IMPLANT
APR CLP MED LRG 11.4X10 (STAPLE)
APR XCLPCLP 20M/L UNV 34X5 (ENDOMECHANICALS) ×1
BAG SPEC RTRVL LRG 6X4 10 (ENDOMECHANICALS) ×1
CABLE HIGH FREQUENCY MONO STRZ (ELECTRODE) ×2 IMPLANT
CANISTER SUCTION 2500CC (MISCELLANEOUS) ×2 IMPLANT
CLIP APPLIE ROT 10 11.4 M/L (STAPLE) IMPLANT
CLOTH BEACON ORANGE TIMEOUT ST (SAFETY) ×2 IMPLANT
COVER MAYO STAND STRL (DRAPES) ×2 IMPLANT
DECANTER SPIKE VIAL GLASS SM (MISCELLANEOUS) ×2 IMPLANT
DRAIN CHANNEL 19F RND (DRAIN) IMPLANT
DRAPE C-ARM 42X72 X-RAY (DRAPES) ×2 IMPLANT
DRAPE LAPAROSCOPIC ABDOMINAL (DRAPES) ×2 IMPLANT
DRAPE WARM FLUID 44X44 (DRAPE) ×2 IMPLANT
DRSG TEGADERM 4X4.75 (GAUZE/BANDAGES/DRESSINGS) ×1 IMPLANT
ELECT REM PT RETURN 9FT ADLT (ELECTROSURGICAL) ×2
ELECTRODE REM PT RTRN 9FT ADLT (ELECTROSURGICAL) ×1 IMPLANT
EVACUATOR SILICONE 100CC (DRAIN) IMPLANT
GAUZE SPONGE 2X2 8PLY STRL LF (GAUZE/BANDAGES/DRESSINGS) ×1 IMPLANT
GLOVE BIOGEL PI IND STRL 7.0 (GLOVE) ×1 IMPLANT
GLOVE BIOGEL PI INDICATOR 7.0 (GLOVE) ×1
GLOVE ECLIPSE 8.0 STRL XLNG CF (GLOVE) ×2 IMPLANT
GLOVE INDICATOR 8.0 STRL GRN (GLOVE) ×2 IMPLANT
GOWN STRL NON-REIN LRG LVL3 (GOWN DISPOSABLE) ×3 IMPLANT
GOWN STRL REIN XL XLG (GOWN DISPOSABLE) ×4 IMPLANT
IV LACTATED RINGER IRRG 3000ML (IV SOLUTION) ×2
IV LR IRRIG 3000ML ARTHROMATIC (IV SOLUTION) ×1 IMPLANT
KIT BASIN OR (CUSTOM PROCEDURE TRAY) ×2 IMPLANT
NS IRRIG 1000ML POUR BTL (IV SOLUTION) ×2 IMPLANT
POUCH SPECIMEN RETRIEVAL 10MM (ENDOMECHANICALS) ×1 IMPLANT
SCALPEL HARMONIC ACE (MISCELLANEOUS) ×2 IMPLANT
SCISSORS LAP 5X35 DISP (ENDOMECHANICALS) ×1 IMPLANT
SET CHOLANGIOGRAPH MIX (MISCELLANEOUS) ×2 IMPLANT
SET IRRIG TUBING LAPAROSCOPIC (IRRIGATION / IRRIGATOR) ×2 IMPLANT
SOLUTION ANTI FOG 6CC (MISCELLANEOUS) IMPLANT
SPONGE GAUZE 2X2 STER 10/PKG (GAUZE/BANDAGES/DRESSINGS) ×1
STRIP CLOSURE SKIN 1/2X4 (GAUZE/BANDAGES/DRESSINGS) IMPLANT
SUT MNCRL AB 4-0 PS2 18 (SUTURE) ×2 IMPLANT
SUT PDS AB 1 CTX 36 (SUTURE) ×1 IMPLANT
SUT VICRYL 0 TIES 12 18 (SUTURE) IMPLANT
TOWEL OR 17X26 10 PK STRL BLUE (TOWEL DISPOSABLE) ×2 IMPLANT
TRAY LAP CHOLE (CUSTOM PROCEDURE TRAY) ×2 IMPLANT
TROCAR 5M 150ML BLDLS (TROCAR) ×2 IMPLANT
TROCAR Z-THREAD FIOS 11X100 BL (TROCAR) IMPLANT
TROCAR Z-THREAD FIOS 5X100MM (TROCAR) ×2 IMPLANT
TUBING INSUFFLATION 10FT LAP (TUBING) ×2 IMPLANT

## 2013-01-25 NOTE — Anesthesia Preprocedure Evaluation (Signed)
Anesthesia Evaluation  Patient identified by MRN, date of birth, ID band Patient awake    Reviewed: Allergy & Precautions, H&P , NPO status , Patient's Chart, lab work & pertinent test results  Airway Mallampati: II TM Distance: >3 FB Neck ROM: Full    Dental no notable dental hx.    Pulmonary neg pulmonary ROS,  breath sounds clear to auscultation  Pulmonary exam normal       Cardiovascular negative cardio ROS  Rhythm:Regular Rate:Normal     Neuro/Psych negative neurological ROS  negative psych ROS   GI/Hepatic negative GI ROS, Neg liver ROS, GERD-  ,  Endo/Other  negative endocrine ROS  Renal/GU negative Renal ROS  negative genitourinary   Musculoskeletal negative musculoskeletal ROS (+)   Abdominal   Peds negative pediatric ROS (+)  Hematology negative hematology ROS (+)   Anesthesia Other Findings   Reproductive/Obstetrics negative OB ROS                           Anesthesia Physical Anesthesia Plan  ASA: II  Anesthesia Plan: General   Post-op Pain Management:    Induction: Intravenous  Airway Management Planned: Oral ETT  Additional Equipment:   Intra-op Plan:   Post-operative Plan: Extubation in OR  Informed Consent: I have reviewed the patients History and Physical, chart, labs and discussed the procedure including the risks, benefits and alternatives for the proposed anesthesia with the patient or authorized representative who has indicated his/her understanding and acceptance.   Dental advisory given  Plan Discussed with: CRNA  Anesthesia Plan Comments:         Anesthesia Quick Evaluation

## 2013-01-25 NOTE — H&P (View-Only) (Signed)
Subjective:     Patient ID: Lisa Tyler, female   DOB: 10/29/1965, 47 y.o.   MRN: [id number]  HPI  Lisa Tyler  03/26/1966 [id number]  Patient Care Team: Donald W Moore, MD as PCP - General (Family Medicine) Kimberly Millsaps as Nurse Practitioner  This patient is a 47 y.o.female who presents today for surgical evaluation at the request of Kimberly Millsaps.   Reason for visit: Abdominal pain.  Question gallbladder etiology  Pleasant obese female.Had an episode of diverticulitis.  Choose antibiotics.  On the episode.  Some thickening of her ileum is well.  Followup CAT scan showed resolution of everything.  Colonoscopy done by Dr. Patterson notes diverticulosis only.  Terminal ileum normal.  Patient now comes to me with a new issue.  To begin get episodes of substernal chest pain/pressure.  She wondered if she had recurrent reflux.  She saw her primary care physician.  She was restarted on protonix.  She thinks has helped some on her reflux but she has still gotten attacks.  She is just her diet.  Avoiding spicy or acid foods.  Trying to transition to a low-fat diet.  However, she has poor tolerance to proteins and raw foods.  Still having discomfort on protonic.  She has had episodes of nausea and vomiting with this.  Often radiates to her back.  Sometimes belching helps.  She has tried Pepcid during the attacks and it does not help.  Worst episode usually happens an hour to after breakfast.  She has been taking a fiber supplement and has a bowel movement twice a day.  No sick contacts at contacts or travel history.  She can walk an hour without difficulty.  No worsening shortness of breath or pressure.  No radiation to her jaw or arm.  This does not feel like reflux.Ultrasound noted stones and sludge.  Therefore, she was sent to me for surgical evaluation to see if the gallbladder is the etiology of her pain  Patient Active Problem List   Diagnosis Date Noted  . Elevated alkaline  phosphatase level 01/23/2013  . Diverticulosis of colon 08/22/2012  . Terminal ileal thickening on CT Nov2013, ?Crohn's? 08/22/2012  . Obesity (BMI 30-39.9) 08/22/2012    Past Medical History  Diagnosis Date  . Diverticulitis of colon with perforation 08/22/2012    History reviewed. No pertinent past surgical history.  History   Social History  . Marital Status: Married    Spouse Name: N/A    Number of Children: 3  . Years of Education: N/A   Occupational History  . poultry producer     self employed   Social History Main Topics  . Smoking status: Never Smoker   . Smokeless tobacco: Never Used  . Alcohol Use: No  . Drug Use: No  . Sexually Active: Not on file   Other Topics Concern  . Not on file   Social History Narrative  . No narrative on file    Family History  Problem Relation Age of Onset  . Crohn's disease Cousin     Maternal cousin  . Breast cancer Maternal Aunt   . Colon cancer Father   . Diabetes Maternal Grandmother   . Colitis Maternal Grandmother     Current Outpatient Prescriptions  Medication Sig Dispense Refill  . pantoprazole (PROTONIX) 20 MG tablet Take 20 mg by mouth daily. Pt unsure of actual dosage      . ibuprofen (ADVIL,MOTRIN) 200 MG tablet Take 200 mg by mouth   every 6 (six) hours as needed. For pain       No current facility-administered medications for this visit.     No Known Allergies  BP 122/86  Pulse 76  Temp(Src) 97 F (36.1 C) (Temporal)  Resp 16  Ht 5' 4" (1.626 m)  Wt 211 lb (95.709 kg)  BMI 36.2 kg/m2  No results found.   Review of Systems  Constitutional: Negative for fever, chills, diaphoresis, appetite change and fatigue.  HENT: Negative for ear pain, sore throat, trouble swallowing, neck pain and ear discharge.   Eyes: Negative for photophobia, discharge and visual disturbance.  Respiratory: Positive for chest tightness. Negative for apnea, cough, choking, shortness of breath, wheezing and stridor.     Cardiovascular: Positive for chest pain. Negative for palpitations and leg swelling.  Gastrointestinal: Positive for nausea, vomiting and abdominal pain. Negative for diarrhea, constipation, anal bleeding and rectal pain.  Genitourinary: Negative for dysuria, frequency and difficulty urinating.  Musculoskeletal: Positive for back pain. Negative for myalgias and gait problem.  Skin: Negative for color change, pallor and rash.  Neurological: Negative for dizziness, speech difficulty, weakness and numbness.  Hematological: Negative for adenopathy.  Psychiatric/Behavioral: Negative for confusion and agitation. The patient is not nervous/anxious.        Objective:   Physical Exam  Constitutional: She is oriented to person, place, and time. She appears well-developed and well-nourished. No distress.  HENT:  Head: Normocephalic.  Mouth/Throat: Oropharynx is clear and moist. No oropharyngeal exudate.  Eyes: Conjunctivae and EOM are normal. Pupils are equal, round, and reactive to light. No scleral icterus.  Neck: Normal range of motion. Neck supple. No tracheal deviation present.  Cardiovascular: Normal rate, regular rhythm and intact distal pulses.   Pulmonary/Chest: Effort normal and breath sounds normal. No respiratory distress. She exhibits no tenderness.  Abdominal: Soft. She exhibits no distension and no mass. There is tenderness in the right upper quadrant. There is no rigidity, no rebound, no guarding, no CVA tenderness, no tenderness at McBurney's point and negative Murphy's sign. No hernia. Hernia confirmed negative in the right inguinal area and confirmed negative in the left inguinal area.    Genitourinary: No vaginal discharge found.  Musculoskeletal: Normal range of motion. She exhibits no tenderness.  Lymphadenopathy:    She has no cervical adenopathy.       Right: No inguinal adenopathy present.       Left: No inguinal adenopathy present.  Neurological: She is alert and  oriented to person, place, and time. No cranial nerve deficit. She exhibits normal muscle tone. Coordination normal.  Skin: Skin is warm and dry. No rash noted. She is not diaphoretic. No erythema.  Psychiatric: She has a normal mood and affect. Her behavior is normal. Judgment and thought content normal.       Assessment:     Postprandial chest/epigastric pain radiating to the back.  Refractory to proton pump inhibitors.  Increased liver function tests on outside labs.  Gallstones/sludge.  Suspicious for biliary etiology     Plan:     At this point, I offered options.  Given the fact she has abnormal liver tests and is tender over her gallbladder, I suspect she would benefit from cholecystectomy.  I cannot rule in or anything else in the differential diagnosis.  Would like to get a chest x-ray and EKG preop anyway.  I did offer for her to see gastroenterology to see if upper endoscopy or further workup is needed, but I am skeptical that   it will have much to offer.  She is more inclined to be aggressive and proceed with cholecystectomy given the lack of improvement on Protonix and other etiologies ruled out:  The anatomy & physiology of hepatobiliary & pancreatic function was discussed.  The pathophysiology of gallbladder dysfunction was discussed.  Natural history risks without surgery was discussed.   I feel the risks of no intervention will lead to serious problems that outweigh the operative risks; therefore, I recommended cholecystectomy to remove the pathology.  I explained laparoscopic techniques with possible need for an open approach.  Probable cholangiogram to evaluate the bilary tract was explained as well.    Risks such as bleeding, infection, abscess, leak, injury to other organs, need for further treatment, heart attack, death, and other risks were discussed.  I noted a good likelihood this will help address the problem.  Possibility that this will not correct all abdominal symptoms was  explained.  Goals of post-operative recovery were discussed as well.  We will work to minimize complications.  An educational handout further explaining the pathology and treatment options was given as well.  Questions were answered.  The patient expresses understanding & wishes to proceed with surgery.        

## 2013-01-25 NOTE — Transfer of Care (Signed)
Immediate Anesthesia Transfer of Care Note  Patient: Lisa Tyler  Procedure(s) Performed: Procedure(s): LAPAROSCOPIC CHOLECYSTECTOMY SINGLE PORT (N/A) INTRAOPERATIVE CHOLANGIOGRAM (N/A)  Patient Location: PACU  Anesthesia Type:General  Level of Consciousness: sedated  Airway & Oxygen Therapy: Patient Spontanous Breathing and Patient connected to face mask oxygen  Post-op Assessment: Report given to PACU RN and Post -op Vital signs reviewed and stable  Post vital signs: Reviewed and stable  Complications: No apparent anesthesia complications

## 2013-01-25 NOTE — Anesthesia Postprocedure Evaluation (Addendum)
  Anesthesia Post-op Note  Patient: Lisa Tyler  Procedure(s) Performed: Procedure(s) (LRB): LAPAROSCOPIC CHOLECYSTECTOMY SINGLE PORT (N/A) INTRAOPERATIVE CHOLANGIOGRAM (N/A)  Patient Location: PACU  Anesthesia Type: General  Level of Consciousness: awake and alert   Airway and Oxygen Therapy: Patient Spontanous Breathing  Post-op Pain: mild  Post-op Assessment: Post-op Vital signs reviewed, Patient's Cardiovascular Status Stable, Respiratory Function Stable, Patent Airway and No signs of Nausea or vomiting  Last Vitals:  Filed Vitals:   01/25/13 1838  BP: 138/86  Pulse: 88  Temp: 36.6 C  Resp: 16    Post-op Vital Signs: stable   Complications: No apparent anesthesia complications

## 2013-01-25 NOTE — Interval H&P Note (Signed)
History and Physical Interval Note:  01/25/2013 1:49 PM  Lisa Tyler  has presented today for surgery, with the diagnosis of symptomatic biliary colic, probable chronic cholecystitis  The various methods of treatment have been discussed with the patient and family. After consideration of risks, benefits and other options for treatment, the patient has consented to  Procedure(s): LAPAROSCOPIC CHOLECYSTECTOMY SINGLE PORT (N/A) INTRAOPERATIVE CHOLANGIOGRAM (N/A) as a surgical intervention .  The patient's history has been reviewed, patient examined, no change in status, stable for surgery.  I have reviewed the patient's chart and labs.  Questions were answered to the patient's satisfaction.     Earma Nicolaou C.

## 2013-01-25 NOTE — Op Note (Signed)
01/25/2013  3:36 PM  PATIENT:  Lisa Tyler  47 y.o. female  Patient Care Team: Ernestina Penna, MD as PCP - General (Family Medicine) Marva Panda as Nurse Practitioner Mardella Layman, MD as Consulting Physician (Gastroenterology) Ardeth Sportsman, MD as Consulting Physician (General Surgery)  PRE-OPERATIVE DIAGNOSIS:  symptomatic biliary colic, probable chronic cholecystitis  POST-OPERATIVE DIAGNOSIS:  Acute on chronic cholecystitis.  Gallstones  PROCEDURE:  Procedure(s): LAPAROSCOPIC CHOLECYSTECTOMY SINGLE PORT INTRAOPERATIVE CHOLANGIOGRAM  SURGEON:  Surgeon(s): Ardeth Sportsman, MD  ASSISTANT: RN  ANESTHESIA:   local and general  EBL:     Delay start of Pharmacological VTE agent (>24hrs) due to surgical blood loss or risk of bleeding:  no  DRAINS: none   SPECIMEN:  Source of Specimen:  Gallbladder & gallstones  DISPOSITION OF SPECIMEN:  PATHOLOGY  COUNTS:  YES  PLAN OF CARE: Discharge to home after PACU  PATIENT DISPOSITION:  PACU - hemodynamically stable.  INDICATION:   Postprandial chest/epigastric pain radiating to the back. Refractory to proton pump inhibitors. Increased liver function tests on outside labs. Gallstones/sludge. Suspicious for biliary etiology.  She agreed to cholecystectomy.  The anatomy & physiology of hepatobiliary & pancreatic function was discussed.  The pathophysiology of gallbladder dysfunction was discussed.  Natural history risks without surgery was discussed.   I feel the risks of no intervention will lead to serious problems that outweigh the operative risks; therefore, I recommended cholecystectomy to remove the pathology.  I explained laparoscopic techniques with possible need for an open approach.  Probable cholangiogram to evaluate the bilary tract was explained as well.    Risks such as bleeding, infection, abscess, leak, injury to other organs, need for further treatment, heart attack, death, and other risks were  discussed.  I noted a good likelihood this will help address the problem.  Possibility that this will not correct all abdominal symptoms was explained.  Goals of post-operative recovery were discussed as well.  We will work to minimize complications.  An educational handout further explaining the pathology and treatment options was given as well.  Questions were answered.  The patient expresses understanding & wishes to proceed with surgery.   OR FINDINGS: Gallbladder wall thickening with enema c/w cholecystitis.  3 large gallstones impacted in lower gallbladder  DESCRIPTION:   The patient was identified & brought in the operating room. The patient was positioned supine with arms tucked. SCDs were active during the entire case. The patient underwent general anesthesia without any difficulty.  The abdomen was prepped and draped in a sterile fashion. A Surgical Timeout confirmed our plan.  I made a transverse curvilinear incision through the superior umbilical fold.  I placed a 5mm long port through the supraumbilical fascia using a modified Hassan cutdown technique. I began carbon dioxide insufflation. Camera inspection revealed no injury. There were no adhesions to the anterior abdominal wall supraumbilically.  I proceeded to continue with single site technique. I placed a #5 port in left upper aspect of the wound. I placed a 5 mm atraumatic grasper in the right inferior aspect of the wound.  I turned attention to the right upper quadrant.  The gallbladder fundus was elevated cephalad. I freed the peritoneal coverings between the gallbladder and the liver on the posteriolateral and anteriomedial walls. I alternated between Harmonic & blunt Maryland dissection to help get a good critical view of the cystic artery and cystic duct.   The gallbladder wall was thickened.  The proximal half of the infundibulum was  difficult to grasp.  Actually had to open up the gallbladder removed three giant stones to be able  to continue dissection.  It was rather intrahepatic.  Eventually I was able to better mobilize the gallbladder off the liver bed.  I did further dissection to free a most of the gallbladder off the liver bed to get a good critical view of the infundibulum and cystic duct. I mobilized the cystic artery; and, after getting a good 360 view, ligated the cystic artery using the Harmonic ultrasonic dissection. I skeletonized the cystic duct.  I placed a clip on the infundibulum. I did a partial cystic duct-otomy and ensured patency. I placed a 5 [place] cholangiocatheter through a puncture site at the right subcostal ridge of the abdominal wall and directed it into the cystic duct.  We ran a cholangiogram with dilute radio-opaque contrast and continuous fluoroscopy.  Contrast flowed from a side branch consistent with cystic duct cannulization. Contrast flowed up the common hepatic duct into the right and left intrahepatic chains out to secondary radicals. Contrast flowed down the common bile duct easily across the normal ampulla into the duodenum.  This was consistent with a normal cholangiogram.  I removed the cholangiocatheter. I placed clips on the cystic duct x4.  I completed cystic duct transection. I freed the gallbladder from its remaining attachments to the liver. I ensured hemostasis on the gallbladder fossa of the liver and elsewhere. I inspected the rest of the abdomen & detected no injury nor bleeding elsewhere.  I placed the gallstones into an Endo Catch bag and removed them.  I had to open the fascia 2 cm to get to the giant stones out.  I placed the gallbladder in a another Endo Catch bag & removed the gallbladder out the supraumbilical fascia. I closed the fascia transversely using #1 PDS & 0 Vicryl interrupted stitches. A closed the skin using 4-0 monocryl stitch.  Sterile dressing was applied. The patient was extubated & arrived in the PACU in stable condition..  I had discussed postoperative  care with the patient in the holding area.  I am about to locate the patient's family and discuss operative findings and postoperative goals / instructions.  Instructions are written in the chart as well.

## 2013-01-26 ENCOUNTER — Encounter (INDEPENDENT_AMBULATORY_CARE_PROVIDER_SITE_OTHER): Payer: Self-pay

## 2013-01-26 ENCOUNTER — Encounter (HOSPITAL_COMMUNITY): Payer: Self-pay | Admitting: Surgery

## 2013-02-02 ENCOUNTER — Encounter (INDEPENDENT_AMBULATORY_CARE_PROVIDER_SITE_OTHER): Payer: Self-pay

## 2013-02-08 ENCOUNTER — Encounter (INDEPENDENT_AMBULATORY_CARE_PROVIDER_SITE_OTHER): Payer: Self-pay | Admitting: Surgery

## 2013-02-08 ENCOUNTER — Ambulatory Visit (INDEPENDENT_AMBULATORY_CARE_PROVIDER_SITE_OTHER): Payer: BC Managed Care – PPO | Admitting: Surgery

## 2013-02-08 VITALS — BP 112/70 | HR 72 | Resp 18 | Ht 64.0 in | Wt 205.0 lb

## 2013-02-08 DIAGNOSIS — K8010 Calculus of gallbladder with chronic cholecystitis without obstruction: Secondary | ICD-10-CM

## 2013-02-08 NOTE — Progress Notes (Signed)
Subjective:     Patient ID: Lisa Tyler, female   DOB: July 08, 1966, 47 y.o.   MRN: [id number]  HPI  [name]  [birth year] [id number]  Patient Care Team: Ernestina Penna, MD as PCP - General (Family Medicine) Marva Panda as Nurse Practitioner Mardella Layman, MD as Consulting Physician (Gastroenterology) Ardeth Sportsman, MD as Consulting Physician (General Surgery)  This patient is a 47 y.o.female who presents today for surgical evaluation   POST-OPERATIVE DIAGNOSIS: Acute on chronic cholecystitis. Gallstones   PROCEDURE 01/25/2013:  LAPAROSCOPIC CHOLECYSTECTOMY SINGLE PORT  INTRAOPERATIVE CHOLANGIOGRAM  SURGEON: Surgeon(s):  Ardeth Sportsman, MD  Diagnosis Gallbladder CHRONIC CHOLECYSTITIS AND CHOLELITHIASIS. Abigail Miyamoto MD Pathologist, Electronic Signature  Patient returns feeling well.  She had one mild attack of epigastric discomfort that persisted for a few days.  However not nearly as intense as before.  No problem since.  He well.  Appetite good.  No nausea or vomiting.  Regular bowel movements.  No severe diarrhea.  Heartburn/reflux under control.  No fevers or chills.  Actually doing more.  Energy level good.  Overall feels like she is making improvements  Patient Active Problem List   Diagnosis Date Noted  . Elevated alkaline phosphatase level 01/23/2013  . Chronic cholecystitis with calculus 01/23/2013  . Diverticulosis of colon 08/22/2012  . Obesity (BMI 30-39.9) 08/22/2012    Past Medical History  Diagnosis Date  . Diverticulitis of colon with perforation 08/22/2012  . GERD (gastroesophageal reflux disease)   . Elevated liver enzymes 01-24-13    4 weeks ago very elevated    Past Surgical History  Procedure Laterality Date  . Childbirth  01-24-13    x3 -NVD  . Laparoscopic cholecystectomy single port N/A 01/25/2013    Procedure: LAPAROSCOPIC CHOLECYSTECTOMY SINGLE PORT;  Surgeon: Ardeth Sportsman, MD;  Location: [facility];  Service: General;   Laterality: N/A;  . Intraoperative cholangiogram N/A 01/25/2013    Procedure: INTRAOPERATIVE CHOLANGIOGRAM;  Surgeon: Ardeth Sportsman, MD;  Location: [facility];  Service: General;  Laterality: N/A;    History   Social History  . Marital Status: Married    Spouse Name: N/A    Number of Children: 3  . Years of Education: N/A   Occupational History  . Educational psychologist     self employed   Social History Main Topics  . Smoking status: Never Smoker   . Smokeless tobacco: Never Used  . Alcohol Use: No  . Drug Use: No  . Sexually Active: Yes   Other Topics Concern  . Not on file   Social History Narrative  . No narrative on file    Family History  Problem Relation Age of Onset  . Crohn's disease Cousin     Maternal cousin  . Breast cancer Maternal Aunt   . Colon cancer Father   . Diabetes Maternal Grandmother   . Colitis Maternal Grandmother     Current Outpatient Prescriptions  Medication Sig Dispense Refill  . pantoprazole (PROTONIX) 40 MG tablet Take 40 mg by mouth daily.       No current facility-administered medications for this visit.     No Known Allergies  BP 112/70  Pulse 72  Resp 18  Ht 5\' 4"  (1.626 m)  Wt 205 lb (92.987 kg)  BMI 35.17 kg/m2  LMP 01/25/2011  Chest 2 View  01/24/2013   *RADIOLOGY REPORT*  Clinical Data: Preop cholecystectomy.  CHEST - 2 VIEW  Comparison: None  Findings: The heart  size and mediastinal contours are within normal limits.  Both lungs are clear.  The visualized skeletal structures are unremarkable.  IMPRESSION: Negative exam.   Original Report Authenticated By: Signa Kell, M.D.   Dg Cholangiogram Operative  01/25/2013   *RADIOLOGY REPORT*  Clinical Data: Cholecystectomy.  INTRAOPERATIVE CHOLANGIOGRAM  Technique:  Multiple fluoroscopic spot radiographs were obtained during intraoperative cholangiogram and are submitted for interpretation post-operatively.  Comparison: CT abdomen and pelvis 09/15/2012.  Findings: Provided images  demonstrate cannulation of the cystic duct.  Visualized biliary and hepatic ducts opacify normally without filling defect or stricture.  Contrast flows into the duodenum.  IMPRESSION: Normal intraoperative cholangiogram.   Original Report Authenticated By: Holley Dexter, M.D.     Review of Systems  Constitutional: Negative for fever, chills and diaphoresis.  HENT: Negative for ear pain, sore throat and trouble swallowing.   Eyes: Negative for photophobia and visual disturbance.  Respiratory: Negative for cough and choking.   Cardiovascular: Negative for chest pain and palpitations.  Gastrointestinal: Negative for nausea, vomiting, abdominal pain, diarrhea, constipation, anal bleeding and rectal pain.  Genitourinary: Negative for dysuria, frequency and difficulty urinating.  Musculoskeletal: Negative for myalgias and gait problem.  Skin: Negative for color change, pallor and rash.  Neurological: Negative for dizziness, speech difficulty, weakness and numbness.  Hematological: Negative for adenopathy.  Psychiatric/Behavioral: Negative for confusion and agitation. The patient is not nervous/anxious.        Objective:   Physical Exam  Constitutional: She is oriented to person, place, and time. She appears well-developed and well-nourished. No distress.  HENT:  Head: Normocephalic.  Mouth/Throat: Oropharynx is clear and moist. No oropharyngeal exudate.  Eyes: Conjunctivae and EOM are normal. Pupils are equal, round, and reactive to light. No scleral icterus.  Neck: Normal range of motion. No tracheal deviation present.  Cardiovascular: Normal rate and intact distal pulses.   Pulmonary/Chest: Effort normal. No respiratory distress. She exhibits no tenderness.  Abdominal: Soft. She exhibits no distension. There is no tenderness. Hernia confirmed negative in the right inguinal area and confirmed negative in the left inguinal area.  Incisions clean with normal healing ridges.  No hernias   Genitourinary: No vaginal discharge found.  Musculoskeletal: Normal range of motion. She exhibits no tenderness.  Lymphadenopathy:       Right: No inguinal adenopathy present.       Left: No inguinal adenopathy present.  Neurological: She is alert and oriented to person, place, and time. No cranial nerve deficit. She exhibits normal muscle tone. Coordination normal.  Skin: Skin is warm and dry. No rash noted. She is not diaphoretic.  Psychiatric: She has a normal mood and affect. Her behavior is normal.       Assessment:     Two weeks status post cholecystectomy for acute on chronic cholecystitis.  Recovering well.     Plan:     Increase activity as tolerated to regular activity.  Low impact exercise such as walking an hour a day at least ideal.  Do not push through pain.  Diet as tolerated.  Low fat high fiber diet ideal.  Bowel regimen with 30 g fiber a day and fiber supplement as needed to avoid problems.  Return to clinic as needed.   Instructions discussed.  Followup with primary care physician for other health issues as would normally be done.  Questions answered.  The patient expressed understanding and appreciation

## 2013-02-08 NOTE — Patient Instructions (Signed)
GETTING TO GOOD BOWEL HEALTH. Irregular bowel habits such as constipation and diarrhea can lead to many problems over time.  Having one soft bowel movement a day is the most important way to prevent further problems.  The anorectal canal is designed to handle stretching and feces to safely manage our ability to get rid of solid waste (feces, poop, stool) out of our body.  BUT, hard constipated stools can act like ripping concrete bricks and diarrhea can be a burning fire to this very sensitive area of our body, causing inflamed hemorrhoids, anal fissures, increasing risk is perirectal abscesses, abdominal pain/bloating, an making irritable bowel worse.     The goal: ONE SOFT BOWEL MOVEMENT A DAY!  To have soft, regular bowel movements:    Drink at least 8 tall glasses of water a day.     Take plenty of fiber.  Fiber is the undigested part of plant food that passes into the colon, acting s "natures broom" to encourage bowel motility and movement.  Fiber can absorb and hold large amounts of water. This results in a larger, bulkier stool, which is soft and easier to pass. Work gradually over several weeks up to 6 servings a day of fiber (25g a day even more if needed) in the form of: o Vegetables -- Root (potatoes, carrots, turnips), leafy [place] (lettuce, salad greens, celery, spinach), or cooked high residue (cabbage, broccoli, etc) o Fruit -- Fresh (unpeeled skin & pulp), Dried (prunes, apricots, cherries, etc ),  or stewed ( applesauce)  o Whole grain breads, pasta, etc (whole wheat)  o Bran cereals    Bulking Agents -- This type of water-retaining fiber generally is easily obtained each day by one of the following:  o Psyllium bran -- The psyllium plant is remarkable because its ground seeds can retain so much water. This product is available as Metamucil, Konsyl, Effersyllium, Per Diem Fiber, or the less expensive generic preparation in drug and health food stores. Although labeled a laxative, it really  is not a laxative.  o Methylcellulose -- This is another fiber derived from wood which also retains water. It is available as Citrucel. o Polyethylene Glycol - and "artificial" fiber commonly called Miralax or Glycolax.  It is helpful for people with gassy or bloated feelings with regular fiber o Flax Seed - a less gassy fiber than psyllium   No reading or other relaxing activity while on the toilet. If bowel movements take longer than 5 minutes, you are too constipated   AVOID CONSTIPATION.  High fiber and water intake usually takes care of this.  Sometimes a laxative is needed to stimulate more frequent bowel movements, but    Laxatives are not a good long-term solution as it can wear the colon out. o Osmotics (Milk of Magnesia, Fleets phosphosoda, Magnesium citrate, MiraLax, GoLytely) are safer than  o Stimulants (Senokot, Castor Oil, Dulcolax, Ex Lax)    o Do not take laxatives for more than 7days in a row.    IF SEVERELY CONSTIPATED, try a Bowel Retraining Program: o Do not use laxatives.  o Eat a diet high in roughage, such as bran cereals and leafy vegetables.  o Drink six (6) ounces of prune or apricot juice each morning.  o Eat two (2) large servings of stewed fruit each day.  o Take one (1) heaping tablespoon of a psyllium-based bulking agent twice a day. Use sugar-free sweetener when possible to avoid excessive calories.  o Eat a normal breakfast.  o   Set aside 15 minutes after breakfast to sit on the toilet, but do not strain to have a bowel movement.  o If you do not have a bowel movement by the third day, use an enema and repeat the above steps.    Controlling diarrhea o Switch to liquids and simpler foods for a few days to avoid stressing your intestines further. o Avoid dairy products (especially milk & ice cream) for a short time.  The intestines often can lose the ability to digest lactose when stressed. o Avoid foods that cause gassiness or bloating.  Typical foods include  beans and other legumes, cabbage, broccoli, and dairy foods.  Every person has some sensitivity to other foods, so listen to our body and avoid those foods that trigger problems for you. o Adding fiber (Citrucel, Metamucil, psyllium, Miralax) gradually can help thicken stools by absorbing excess fluid and retrain the intestines to act more normally.  Slowly increase the dose over a few weeks.  Too much fiber too soon can backfire and cause cramping & bloating. o Probiotics (such as active yogurt, Align, etc) may help repopulate the intestines and colon with normal bacteria and calm down a sensitive digestive tract.  Most studies show it to be of mild help, though, and such products can be costly. o Medicines:   Bismuth subsalicylate (ex. Kayopectate, Pepto Bismol) every 30 minutes for up to 6 doses can help control diarrhea.  Avoid if pregnant.   Loperamide (Immodium) can slow down diarrhea.  Start with two tablets (4mg  total) first and then try one tablet every 6 hours.  Avoid if you are having fevers or severe pain.  If you are not better or start feeling worse, stop all medicines and call your doctor for advice o Call your doctor if you are getting worse or not better.  Sometimes further testing (cultures, endoscopy, X-ray studies, bloodwork, etc) may be needed to help diagnose and treat the cause of the diarrhea.  LAPAROSCOPIC SURGERY: POST OP INSTRUCTIONS  1. DIET: Follow a light bland diet the first 24 hours after arrival home, such as soup, liquids, crackers, etc.  Be sure to include lots of fluids daily.  Avoid fast food or heavy meals as your are more likely to get nauseated.  Eat a low fat the next few days after surgery.   2. Take your usually prescribed home medications unless otherwise directed. 3. PAIN CONTROL: a. Pain is best controlled by a usual combination of three different methods TOGETHER: i. Ice/Heat ii. Over the counter pain medication iii. Prescription pain medication b. Most  patients will experience some swelling and bruising around the incisions.  Ice packs or heating pads (30-60 minutes up to 6 times a day) will help. Use ice for the first few days to help decrease swelling and bruising, then switch to heat to help relax tight/sore spots and speed recovery.  Some people prefer to use ice alone, heat alone, alternating between ice & heat.  Experiment to what works for you.  Swelling and bruising can take several weeks to resolve.   c. It is helpful to take an over-the-counter pain medication regularly for the first few weeks.  Choose one of the following that works best for you: i. Naproxen (Aleve, etc)  Two 220mg  tabs twice a day ii. Ibuprofen (Advil, etc) Three 200mg  tabs four times a day (every meal & bedtime) iii. Acetaminophen (Tylenol, etc) 500-650mg  four times a day (every meal & bedtime) d. A  prescription for pain  medication (such as oxycodone, hydrocodone, etc) should be given to you upon discharge.  Take your pain medication as prescribed.  i. If you are having problems/concerns with the prescription medicine (does not control pain, nausea, vomiting, rash, itching, etc), please call us [phone] to see if we need to switch you to a different pain medicine that will work better for you and/or control your side effect better. ii. If you need a refill on your pain medication, please contact your pharmacy.  They will contact our office to request authorization. Prescriptions will not be filled after 5 pm or on week-ends. 4. Avoid getting constipated.  Between the surgery and the pain medications, it is common to experience some constipation.  Increasing fluid intake and taking a fiber supplement (such as Metamucil, Citrucel, FiberCon, MiraLax, etc) 1-2 times a day regularly will usually help prevent this problem from occurring.  A mild laxative (prune juice, Milk of Magnesia, MiraLax, etc) should be taken according to package directions if there are no bowel  movements after 48 hours.   5. Watch out for diarrhea.  If you have many loose bowel movements, simplify your diet to bland foods & liquids for a few days.  Stop any stool softeners and decrease your fiber supplement.  Switching to mild anti-diarrheal medications (Kayopectate, Pepto Bismol) can help.  If this worsens or does not improve, please call us. 6. Wash / shower every day.  You may shower over the dressings as they are waterproof.  Continue to shower over incision(s) after the dressing is off. 7. Remove your waterproof bandages 5 days after surgery.  You may leave the incision open to air.  You may replace a dressing/Band-Aid to cover the incision for comfort if you wish.  8. ACTIVITIES as tolerated:   a. You may resume regular (light) daily activities beginning the next day-such as daily self-care, walking, climbing stairs-gradually increasing activities as tolerated.  If you can walk 30 minutes without difficulty, it is safe to try more intense activity such as jogging, treadmill, bicycling, low-impact aerobics, swimming, etc. b. Save the most intensive and strenuous activity for last such as sit-ups, heavy lifting, contact sports, etc  Refrain from any heavy lifting or straining until you are off narcotics for pain control.   c. DO NOT PUSH THROUGH PAIN.  Let pain be your guide: If it hurts to do something, don't do it.  Pain is your body warning you to avoid that activity for another week until the pain goes down. d. You may drive when you are no longer taking prescription pain medication, you can comfortably wear a seatbelt, and you can safely maneuver your car and apply brakes. e. Bonita Quin may have sexual intercourse when it is comfortable.  9. FOLLOW UP in our office a. Please call [facility] at [phone] to set up an appointment to see your surgeon in the office for a follow-up appointment approximately 2-3 weeks after your surgery. b. Make sure that you call for this appointment the day you  arrive home to insure a convenient appointment time. 10. IF YOU HAVE DISABILITY OR FAMILY LEAVE FORMS, BRING THEM TO THE OFFICE FOR PROCESSING.  DO NOT GIVE THEM TO YOUR DOCTOR.   WHEN TO CALL us [phone]: 1. Poor pain control 2. Reactions / problems with new medications (rash/itching, nausea, etc)  3. Fever over 101.5 F (38.5 C) 4. Inability to urinate 5. Nausea and/or vomiting 6. Worsening swelling or bruising 7. Continued bleeding from incision. 8.  Increased pain, redness, or drainage from the incision   The clinic staff is available to answer your questions during regular business hours (8:30am-5pm).  Please don't hesitate to call and ask to speak to one of our nurses for clinical concerns.   If you have a medical emergency, go to the nearest emergency room or call 911.  A surgeon from [facility] is always on call at the [facility], [place] [address], [place], [place]  [address] ? MAIN: [phone] ? TOLL FREE: [phone] ?  FAX [phone] [email or web address]  Cholecystitis Cholecystitis is an inflammation of your gallbladder. It is usually caused by a buildup of gallstones or sludge (cholelithiasis) in your gallbladder. The gallbladder stores a fluid that helps digest fats (bile). Cholecystitis is serious and needs treatment right away.  CAUSES   Gallstones. Gallstones can block the tube that leads to your gallbladder, causing bile to build up. As bile builds up, the gallbladder becomes inflamed.  Bile duct problems, such as blockage from scarring or kinking.  Tumors. Tumors can stop bile from leaving your gallbladder correctly, causing bile to build up. As bile builds up, the gallbladder becomes inflamed. SYMPTOMS   Nausea.  Vomiting.  Abdominal pain, especially in the upper right area of your abdomen.  Abdominal tenderness or bloating.  Sweating.  Chills.  Fever.  Yellowing of  the skin and the whites of the eyes (jaundice). DIAGNOSIS  Your caregiver may order blood tests to look for infection or gallbladder problems. Your caregiver may also order imaging tests, such as an ultrasound or computed tomography (CT) scan. Further tests may include a hepatobiliary iminodiacetic acid (HIDA) scan. This scan allows your caregiver to see your bile move from the liver to the gallbladder and to the small intestine. TREATMENT  A hospital stay is usually necessary to lessen the inflammation of your gallbladder. You may be required to not eat or drink (fast) for a certain amount of time. You may be given medicine to treat pain or an antibiotic medicine to treat an infection. Surgery may be needed to remove your gallbladder (cholecystectomy) once the inflammation has gone down. Surgery may be needed right away if you develop complications such as death of gallbladder tissue (gangrene) or a tear (perforation) of the gallbladder.  HOME CARE INSTRUCTIONS  Home care will depend on your treatment. In general:  If you were given antibiotics, take them as directed. Finish them even if you start to feel better.  Only take over-the-counter or prescription medicines for pain, discomfort, or fever as directed by your caregiver.  Follow a low-fat diet until you see your caregiver again.  Keep all follow-up visits as directed by your caregiver. SEEK IMMEDIATE MEDICAL CARE IF:   Your pain is increasing and not controlled by medicines.  Your pain moves to another part of your abdomen or to your back.  You have a fever.  You have nausea and vomiting. MAKE SURE YOU:  Understand these instructions.  Will watch your condition.  Will get help right away if you are not doing well or get worse. Document Released: 08/30/2005 Document Revised: 11/22/2011 Document Reviewed: 07/16/2011 [facility] Patient Information 2014 [place], [place].

## 2013-06-03 IMAGING — RF DG CHOLANGIOGRAM OPERATIVE
1 series · 4 of 4 positions shown · non-contrast
Comparison: CT abdomen and pelvis 09/15/2012.

CLINICAL DATA: Cholecystectomy.

INTRAOPERATIVE CHOLANGIOGRAM
TECHNIQUE: Multiple fluoroscopic spot radiographs were obtained
during intraoperative cholangiogram and are submitted for
interpretation post-operatively.

[Series 1: run · 4 of 48 frames shown]
[frame 1/48]
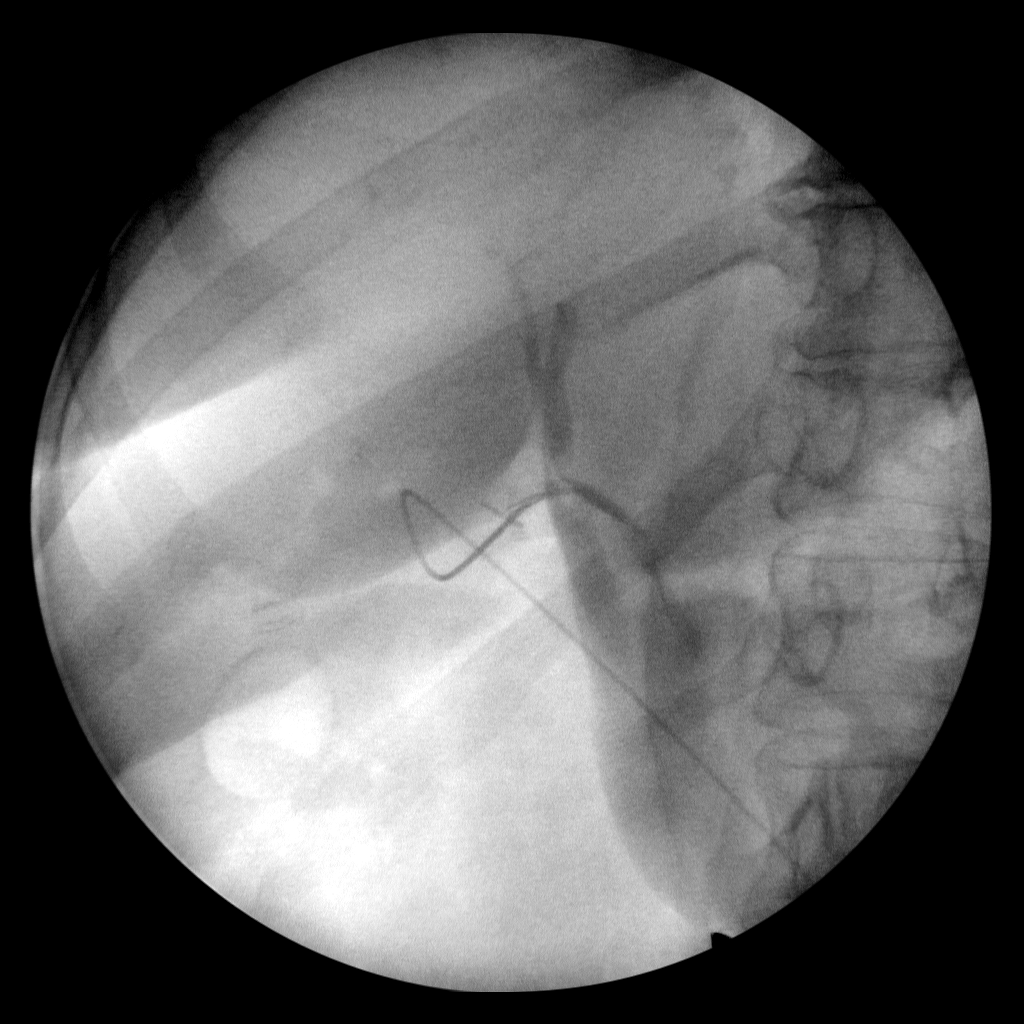
[frame 8/48]
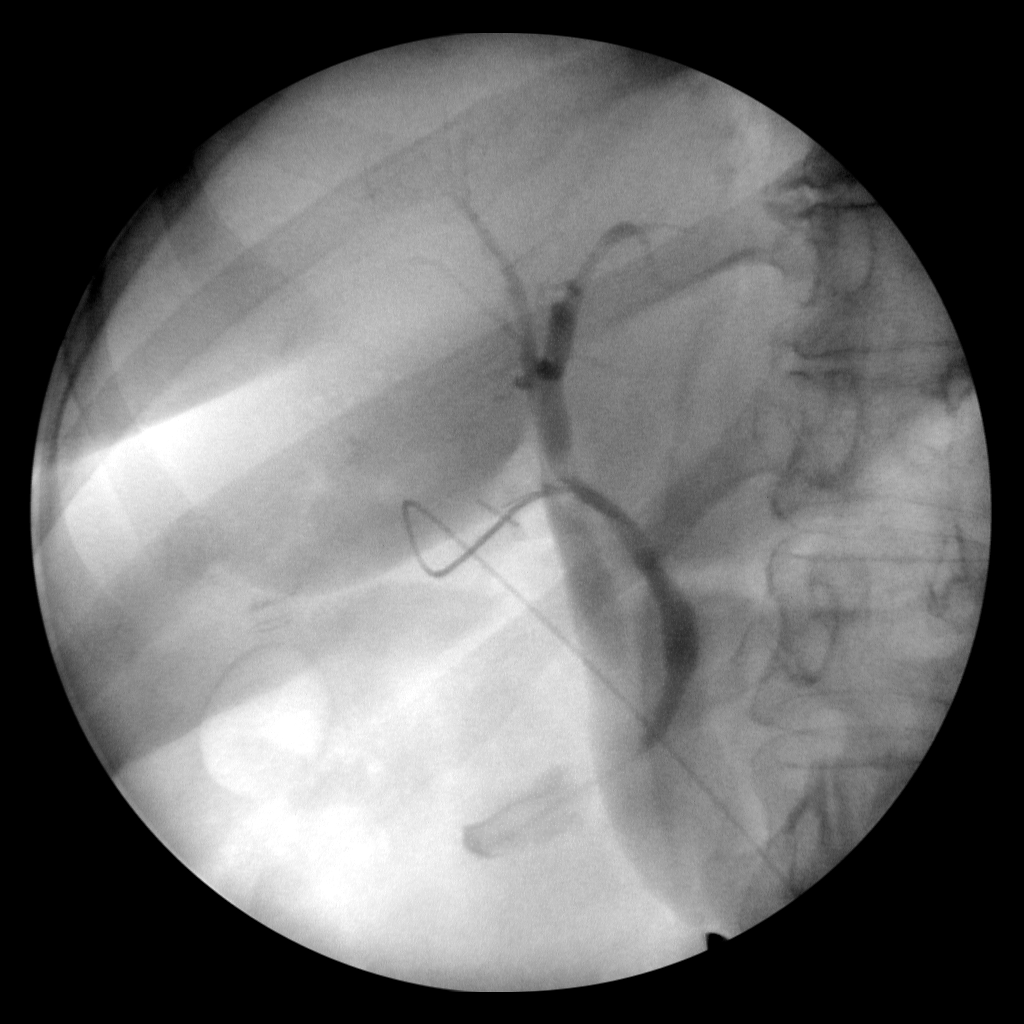
[frame 25/48]
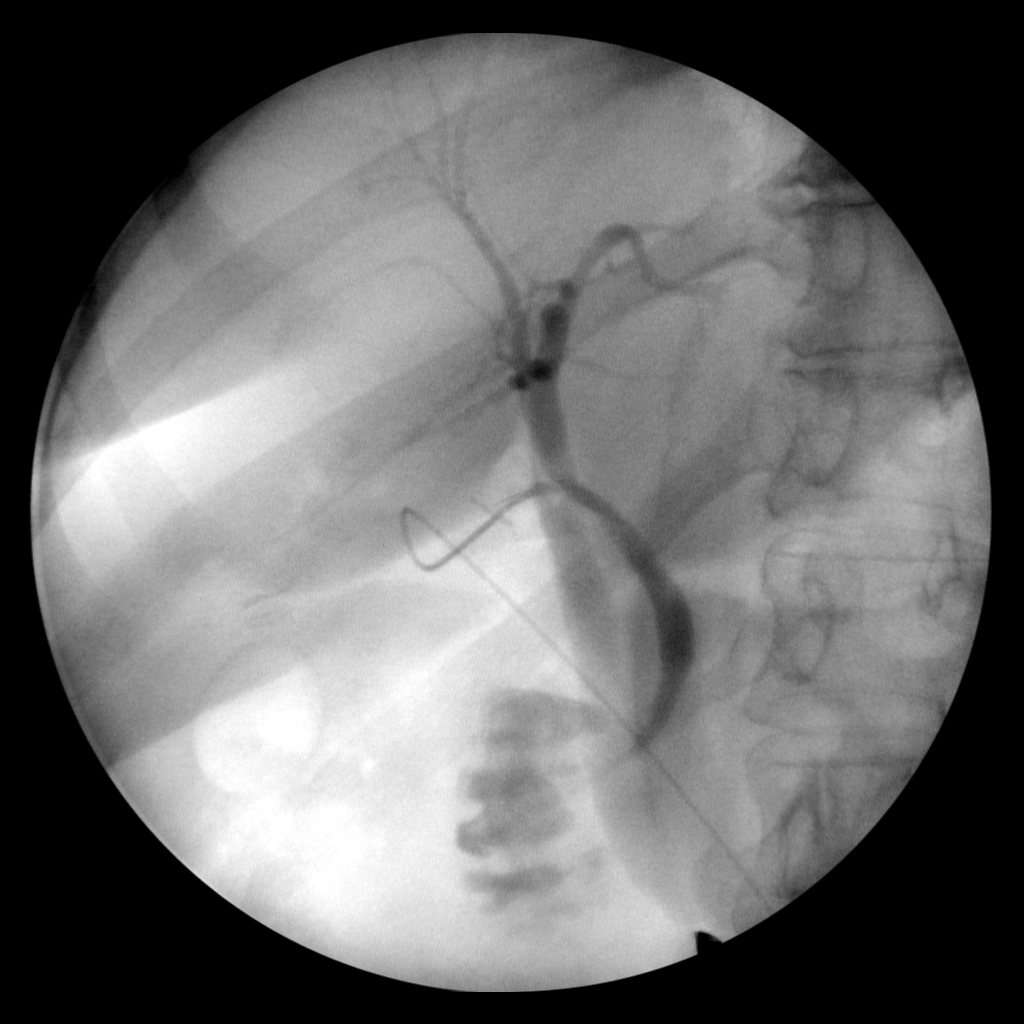
[frame 41/48]
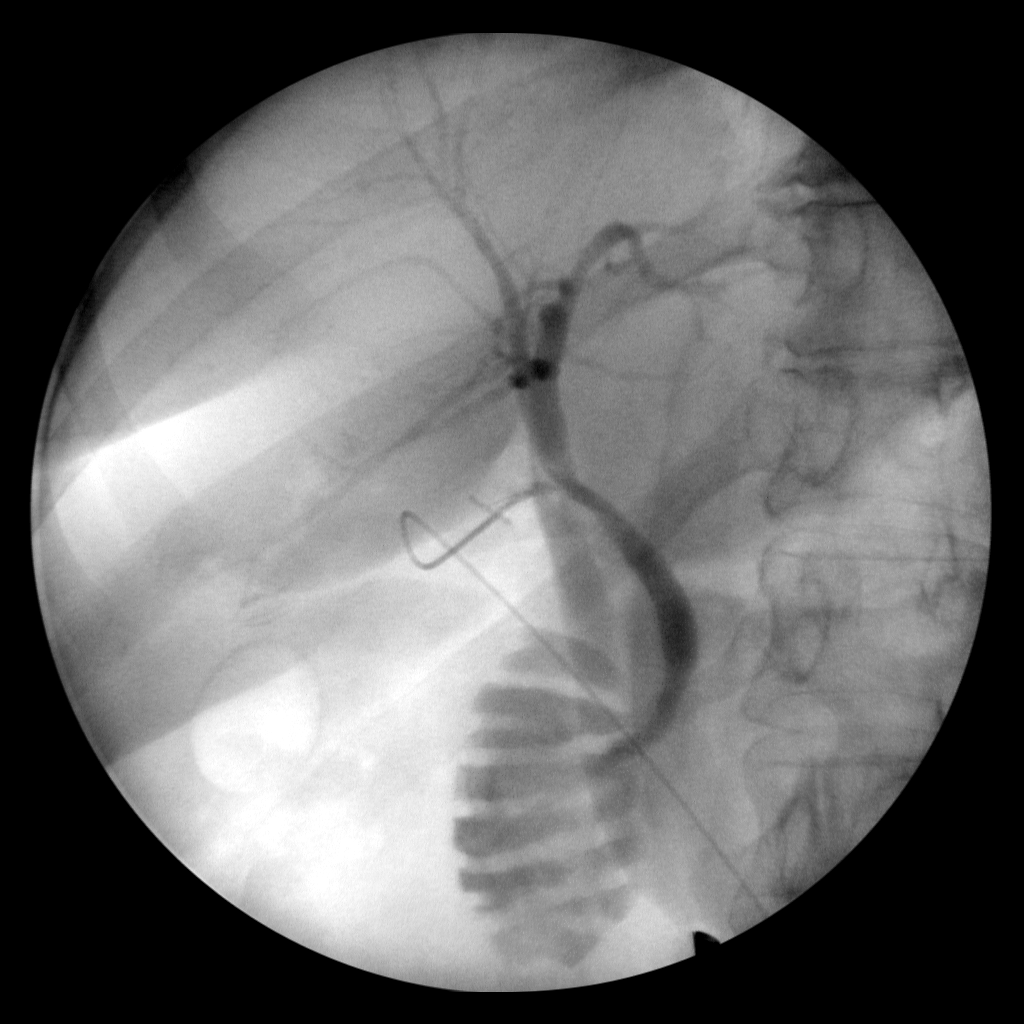

[4 of 4 positions shown; findings below may reference images not displayed]

FINDINGS: Provided images demonstrate cannulation of the cystic
duct.  Visualized biliary and hepatic ducts opacify normally
without filling defect or stricture.  Contrast flows into the
duodenum.
IMPRESSION: Normal intraoperative cholangiogram.

## 2014-05-29 ENCOUNTER — Encounter: Payer: Self-pay | Admitting: Podiatry

## 2014-05-29 ENCOUNTER — Ambulatory Visit (INDEPENDENT_AMBULATORY_CARE_PROVIDER_SITE_OTHER): Payer: BC Managed Care – PPO

## 2014-05-29 ENCOUNTER — Ambulatory Visit (INDEPENDENT_AMBULATORY_CARE_PROVIDER_SITE_OTHER): Payer: BC Managed Care – PPO | Admitting: Podiatry

## 2014-05-29 VITALS — BP 124/88 | HR 80 | Resp 16 | Ht 64.0 in | Wt 224.0 lb

## 2014-05-29 DIAGNOSIS — M722 Plantar fascial fibromatosis: Secondary | ICD-10-CM

## 2014-05-29 DIAGNOSIS — L600 Ingrowing nail: Secondary | ICD-10-CM

## 2014-05-29 DIAGNOSIS — M779 Enthesopathy, unspecified: Secondary | ICD-10-CM

## 2014-05-29 MED ORDER — NEOMYCIN-POLYMYXIN-HC 3.5-10000-1 OT SOLN: OTIC | Status: DC

## 2014-05-29 NOTE — Patient Instructions (Signed)
Plantar Fasciitis (Heel Spur Syndrome) with Rehab The plantar fascia is a fibrous, ligament-like, soft-tissue structure that spans the bottom of the foot. Plantar fasciitis is a condition that causes pain in the foot due to inflammation of the tissue. SYMPTOMS   Pain and tenderness on the underneath side of the foot.  Pain that worsens with standing or walking. CAUSES  Plantar fasciitis is caused by irritation and injury to the plantar fascia on the underneath side of the foot. Common mechanisms of injury include:  Direct trauma to bottom of the foot.  Damage to a small nerve that runs under the foot where the main fascia attaches to the heel bone.  Stress placed on the plantar fascia due to bone spurs. RISK INCREASES WITH:   Activities that place stress on the plantar fascia (running, jumping, pivoting, or cutting).  Poor strength and flexibility.  Improperly fitted shoes.  Tight calf muscles.  Flat feet.  Failure to warm-up properly before activity.  Obesity. PREVENTION  Warm up and stretch properly before activity.  Allow for adequate recovery between workouts.  Maintain physical fitness:  Strength, flexibility, and endurance.  Cardiovascular fitness.  Maintain a health body weight.  Avoid stress on the plantar fascia.  Wear properly fitted shoes, including arch supports for individuals who have flat feet. PROGNOSIS  If treated properly, then the symptoms of plantar fasciitis usually resolve without surgery. However, occasionally surgery is necessary. RELATED COMPLICATIONS   Recurrent symptoms that may result in a chronic condition.  Problems of the lower back that are caused by compensating for the injury, such as limping.  Pain or weakness of the foot during push-off following surgery.  Chronic inflammation, scarring, and partial or complete fascia tear, occurring more often from repeated injections. TREATMENT  Treatment initially involves the use of  ice and medication to help reduce pain and inflammation. The use of strengthening and stretching exercises may help reduce pain with activity, especially stretches of the Achilles tendon. These exercises may be performed at home or with a therapist. Your caregiver may recommend that you use heel cups of arch supports to help reduce stress on the plantar fascia. Occasionally, corticosteroid injections are given to reduce inflammation. If symptoms persist for greater than 6 months despite non-surgical (conservative), then surgery may be recommended.  MEDICATION   If pain medication is necessary, then nonsteroidal anti-inflammatory medications, such as aspirin and ibuprofen, or other minor pain relievers, such as acetaminophen, are often recommended.  Do not take pain medication within 7 days before surgery.  Prescription pain relievers may be given if deemed necessary by your caregiver. Use only as directed and only as much as you need.  Corticosteroid injections may be given by your caregiver. These injections should be reserved for the most serious cases, because they may only be given a certain number of times. HEAT AND COLD  Cold treatment (icing) relieves pain and reduces inflammation. Cold treatment should be applied for 10 to 15 minutes every 2 to 3 hours for inflammation and pain and immediately after any activity that aggravates your symptoms. Use ice packs or massage the area with a piece of ice (ice massage).  Heat treatment may be used prior to performing the stretching and strengthening activities prescribed by your caregiver, physical therapist, or athletic trainer. Use a heat pack or soak the injury in warm water. SEEK IMMEDIATE MEDICAL CARE IF:  Treatment seems to offer no benefit, or the condition worsens.  Any medications produce adverse side effects. EXERCISES RANGE   OF MOTION (ROM) AND STRETCHING EXERCISES - Plantar Fasciitis (Heel Spur Syndrome) These exercises may help you  when beginning to rehabilitate your injury. Your symptoms may resolve with or without further involvement from your physician, physical therapist or athletic trainer. While completing these exercises, remember:   Restoring tissue flexibility helps normal motion to return to the joints. This allows healthier, less painful movement and activity.  An effective stretch should be held for at least 30 seconds.  A stretch should never be painful. You should only feel a gentle lengthening or release in the stretched tissue. RANGE OF MOTION - Toe Extension, Flexion  Sit with your right / left leg crossed over your opposite knee.  Grasp your toes and gently pull them back toward the top of your foot. You should feel a stretch on the bottom of your toes and/or foot.  Hold this stretch for __________ seconds.  Now, gently pull your toes toward the bottom of your foot. You should feel a stretch on the top of your toes and or foot.  Hold this stretch for __________ seconds. Repeat __________ times. Complete this stretch __________ times per day.  RANGE OF MOTION - Ankle Dorsiflexion, Active Assisted  Remove shoes and sit on a chair that is preferably not on a carpeted surface.  Place right / left foot under knee. Extend your opposite leg for support.  Keeping your heel down, slide your right / left foot back toward the chair until you feel a stretch at your ankle or calf. If you do not feel a stretch, slide your bottom forward to the edge of the chair, while still keeping your heel down.  Hold this stretch for __________ seconds. Repeat __________ times. Complete this stretch __________ times per day.  STRETCH - Gastroc, Standing  Place hands on wall.  Extend right / left leg, keeping the front knee somewhat bent.  Slightly point your toes inward on your back foot.  Keeping your right / left heel on the floor and your knee straight, shift your weight toward the wall, not allowing your back to  arch.  You should feel a gentle stretch in the right / left calf. Hold this position for __________ seconds. Repeat __________ times. Complete this stretch __________ times per day. STRETCH - Soleus, Standing  Place hands on wall.  Extend right / left leg, keeping the other knee somewhat bent.  Slightly point your toes inward on your back foot.  Keep your right / left heel on the floor, bend your back knee, and slightly shift your weight over the back leg so that you feel a gentle stretch deep in your back calf.  Hold this position for __________ seconds. Repeat __________ times. Complete this stretch __________ times per day. STRETCH - Gastrocsoleus, Standing  Note: This exercise can place a lot of stress on your foot and ankle. Please complete this exercise only if specifically instructed by your caregiver.   Place the ball of your right / left foot on a step, keeping your other foot firmly on the same step.  Hold on to the wall or a rail for balance.  Slowly lift your other foot, allowing your body weight to press your heel down over the edge of the step.  You should feel a stretch in your right / left calf.  Hold this position for __________ seconds.  Repeat this exercise with a slight bend in your right / left knee. Repeat __________ times. Complete this stretch __________ times per day.    STRENGTHENING EXERCISES - Plantar Fasciitis (Heel Spur Syndrome)  These exercises may help you when beginning to rehabilitate your injury. They may resolve your symptoms with or without further involvement from your physician, physical therapist or athletic trainer. While completing these exercises, remember:   Muscles can gain both the endurance and the strength needed for everyday activities through controlled exercises.  Complete these exercises as instructed by your physician, physical therapist or athletic trainer. Progress the resistance and repetitions only as guided. STRENGTH -  Towel Curls  Sit in a chair positioned on a non-carpeted surface.  Place your foot on a towel, keeping your heel on the floor.  Pull the towel toward your heel by only curling your toes. Keep your heel on the floor.  If instructed by your physician, physical therapist or athletic trainer, add ____________________ at the end of the towel. Repeat __________ times. Complete this exercise __________ times per day. STRENGTH - Ankle Inversion  Secure one end of a rubber exercise band/tubing to a fixed object (table, pole). Loop the other end around your foot just before your toes.  Place your fists between your knees. This will focus your strengthening at your ankle.  Slowly, pull your big toe up and in, making sure the band/tubing is positioned to resist the entire motion.  Hold this position for __________ seconds.  Have your muscles resist the band/tubing as it slowly pulls your foot back to the starting position. Repeat __________ times. Complete this exercises __________ times per day.  Document Released: 08/30/2005 Document Revised: 11/22/2011 Document Reviewed: 12/12/2008 [facility] Patient Information 2015 [place], [place]. This information is not intended to replace advice given to you by your health care provider. Make sure you discuss any questions you have with your health care provider. Betadine Soak Instructions  Purchase an 8 oz. bottle of BETADINE solution (Povidone)  THE DAY AFTER THE PROCEDURE  Place 1 tablespoon of betadine solution in a quart of warm tap water.  Submerge your foot or feet with outer bandage intact for the initial soak; this will allow the bandage to become moist and wet for easy lift off.  Once you remove your bandage, continue to soak in the solution for 20 minutes.  This soak should be done twice a day.  Next, remove your foot or feet from solution, blot dry the affected area and cover.  You may use a band aid large enough to cover the area or use gauze and  tape.  Apply other medications to the area as directed by the doctor such as cortisporin otic solution (ear drops) or neosporin.  IF YOUR SKIN BECOMES IRRITATED WHILE USING THESE INSTRUCTIONS, IT IS OKAY TO SWITCH TO EPSOM SALTS AND WATER OR WHITE VINEGAR AND WATER.

## 2014-05-29 NOTE — Progress Notes (Signed)
   Subjective:    Patient ID: Lisa Tyler, female    DOB: 11-12-1965, 48 y.o.   MRN: [id number]  HPI Comments: i have fungus on my big toes. The big toenail on my left foot fell off. The toenails are sore. i have a callus on both feet. They hurt as well. My left foot on the side hurts. (medial). Ive had foot problems for a while. They are getting worse. im not able to walk on the tread mill. It hurts to stand. My feet will burn. i dont do anything for my feet.  Foot Pain      Review of Systems  All other systems reviewed and are negative.      Objective:   Physical Exam reviewed her past medical history medications allergies surgeries social history and review systems. Pulses are strongly palpable bilateral. Neurologic sensorium is intact per since once the monofilament. Deep tendon reflexes are intact bilateral muscle strength is 5 over 5 dorsiflexors flexors inverters everters all intrinsic musculature is intact. Orthopedic evaluation demonstrates pain on palpation medial continued tubercle of the left heel. She has limited range of motion of the first metatarsophalangeal joint of the left foot with mild bunion deformity. Radiographic evaluation does confirm moderate soft tissue increase in density at the plantar fascial calcaneal insertion sites and some joint space narrowing of the first metatarsophalangeal joint. An increase in the first intermetatarsal angle is consistent with hallux abductovalgus deformity. Mild hallux limitus is noted. Cutaneous evaluation demonstrates supple well hydrated cutis porokeratosis sub-second metatarsophalangeal joint bilateral. Sharp incurvated nail margins along the tibial and fibular border of the hallux right. The nail dystrophy and a new nail growing to the hallux left.        Assessment & Plan:  Assessment: Plantar fasciitis left. 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 Ingrown toenail hallux right.  Plan: Injected  the left heel. Performed a chemical matrixectomy after local anesthesia to the hallux right which she tolerated well. Her left foot was put in a plantar fascial strapping and a night splint was dispensed. She was given both oral and written home-going instructions were the soaking therapy of her right great toe and a prescription for Cortisporin Otic. I will followup with her in one week she should questions or concerns notify us immediately. We will reevaluate plantar fasciitis at that time as well.

## 2014-06-05 ENCOUNTER — Encounter: Payer: Self-pay | Admitting: Podiatry

## 2014-06-05 ENCOUNTER — Ambulatory Visit (INDEPENDENT_AMBULATORY_CARE_PROVIDER_SITE_OTHER): Payer: BC Managed Care – PPO | Admitting: Podiatry

## 2014-06-05 DIAGNOSIS — Q828 Other specified congenital malformations of skin: Secondary | ICD-10-CM

## 2014-06-05 DIAGNOSIS — M722 Plantar fascial fibromatosis: Secondary | ICD-10-CM

## 2014-06-05 DIAGNOSIS — L600 Ingrowing nail: Secondary | ICD-10-CM

## 2014-06-06 NOTE — Progress Notes (Signed)
She presents today for followup of her matrixectomy hallux right and right heel pain. She states that the heel pain in the toe sitting to be doing great however she does have some tenderness underneath the second metatarsophalangeal joint of the left foot. She like me to look at this.  Objective: Vital signs are stable she is alert and oriented x3 matrixectomy hallux right appears to be healing well she has no pain on palpation medial continued tubercle of the left heel. See no signs of infection a day surgical site. She does have a porokeratotic lesion sub-second metatarsophalangeal joint of the left foot.  Assessment: Well-healing surgical toe hallux right resolving plantar fasciitis left. Porokeratosis left foot.  Plan: Discussed etiology pathology conservative versus surgical therapies. At this point we debrided the lesion in place salicylic acid under occlusion to be left on for 3 days. I will followup with her in a few with for reevaluation.

## 2014-07-08 ENCOUNTER — Encounter: Payer: Self-pay | Admitting: Podiatry

## 2014-07-08 ENCOUNTER — Ambulatory Visit (INDEPENDENT_AMBULATORY_CARE_PROVIDER_SITE_OTHER): Payer: BC Managed Care – PPO | Admitting: Podiatry

## 2014-07-08 VITALS — BP 122/80 | HR 78 | Resp 16

## 2014-07-08 DIAGNOSIS — Q828 Other specified congenital malformations of skin: Secondary | ICD-10-CM

## 2014-07-08 NOTE — Progress Notes (Signed)
She presents today for follow-up of her porokeratosis and plantar aspect of her left foot. She states this seems to be doing much better.  Objective: Vital signs are stable she is alert and oriented 3 pulses are palpable left foot. Porokeratotic lesion subsecond metatarsophalangeal joint of the left foot without signs of cellulitis or infection.  Assessment: Porokeratosis plantar aspect left foot.  Plan: Chemical debridement of the wound today under occlusion with salicylic acid to be left alone for 3 days. She will follow up with me in 4-6 weeks.

## 2014-08-05 ENCOUNTER — Ambulatory Visit (INDEPENDENT_AMBULATORY_CARE_PROVIDER_SITE_OTHER): Payer: BC Managed Care – PPO | Admitting: Podiatry

## 2014-08-05 VITALS — BP 132/88 | HR 111 | Resp 16

## 2014-08-05 DIAGNOSIS — Q828 Other specified congenital malformations of skin: Secondary | ICD-10-CM

## 2014-08-05 NOTE — Progress Notes (Signed)
She presents today for follow-up of porokeratosis plantar aspect of the bilateral foot. She states that it feels much better.  Objective: Vital signs are stable she is alert and oriented 3 reactive hyper porokeratosis to the plantar aspect of the left foot greater than the right. No erythema edema cellulitis drainage or odor. I debrided the porokeratotic E lesion very easily today without symptoms. Was debrided the patient was asymptomatic.  Assessment: Porokeratosis left greater than right forefoot.  Plan: Debridement today follow up with her as needed.

## 2015-09-23 ENCOUNTER — Encounter: Payer: Self-pay | Admitting: Internal Medicine

## 2016-12-28 DIAGNOSIS — I10 Essential (primary) hypertension: Secondary | ICD-10-CM | POA: Insufficient documentation

## 2016-12-29 ENCOUNTER — Other Ambulatory Visit: Payer: Self-pay | Admitting: *Deleted

## 2016-12-29 DIAGNOSIS — Z1231 Encounter for screening mammogram for malignant neoplasm of breast: Secondary | ICD-10-CM

## 2017-01-12 ENCOUNTER — Ambulatory Visit
Admission: RE | Admit: 2017-01-12 | Discharge: 2017-01-12 | Disposition: A | Discharge status: Home / Self Care | Payer: BLUE CROSS/BLUE SHIELD | Source: Ambulatory Visit | Attending: *Deleted | Admitting: *Deleted

## 2017-01-12 DIAGNOSIS — Z1231 Encounter for screening mammogram for malignant neoplasm of breast: Secondary | ICD-10-CM

## 2017-01-12 LAB — HM MAMMOGRAPHY

## 2017-10-19 ENCOUNTER — Encounter: Payer: Self-pay | Admitting: Gastroenterology

## 2017-10-31 ENCOUNTER — Encounter: Payer: Self-pay | Admitting: Podiatry

## 2017-10-31 ENCOUNTER — Ambulatory Visit (INDEPENDENT_AMBULATORY_CARE_PROVIDER_SITE_OTHER): Payer: Self-pay | Admitting: Podiatry

## 2017-10-31 VITALS — BP 126/84 | HR 100 | Resp 16

## 2017-10-31 DIAGNOSIS — L600 Ingrowing nail: Secondary | ICD-10-CM

## 2017-10-31 DIAGNOSIS — R109 Unspecified abdominal pain: Secondary | ICD-10-CM | POA: Insufficient documentation

## 2017-10-31 DIAGNOSIS — L603 Nail dystrophy: Secondary | ICD-10-CM

## 2017-10-31 NOTE — Patient Instructions (Signed)

## 2017-10-31 NOTE — Progress Notes (Signed)
  Subjective:  Patient ID: Lisa HawthorneSandra L Jurney, female    DOB: [birth year],  MRN: [id number] HPI Chief Complaint  Patient presents with  . Nail Problem    Halllux nail left - thick, long and dark x several months, unable to cut, starting to become sore pressing into tip of toe    52 y.o. female presents with the above complaint.     Past Medical History:  Diagnosis Date  . Diverticulitis of colon with perforation 08/22/2012  . Elevated liver enzymes 01-24-13   4 weeks ago very elevated  . GERD (gastroesophageal reflux disease)    Past Surgical History:  Procedure Laterality Date  . childbirth  01-24-13   x3 -NVD  . INTRAOPERATIVE CHOLANGIOGRAM N/A 01/25/2013   Procedure: INTRAOPERATIVE CHOLANGIOGRAM;  Surgeon: Ardeth SportsmanSteven C. Gross, MD;  Location: [facility];  Service: General;  Laterality: N/A;  . LAPAROSCOPIC CHOLECYSTECTOMY SINGLE PORT N/A 01/25/2013   Procedure: LAPAROSCOPIC CHOLECYSTECTOMY SINGLE PORT;  Surgeon: Ardeth SportsmanSteven C. Gross, MD;  Location: [facility];  Service: General;  Laterality: N/A;    Current Outpatient Medications:  .  FLUZONE QUADRIVALENT 0.5 ML injection, TO BE ADMINISTERED BY PHARMACIST FOR IMMUNIZATION, Disp: , Rfl: 0 .  hydrochlorothiazide (HYDRODIURIL) 25 MG tablet, Take 25 mg by mouth daily., Disp: , Rfl: 1  No Known Allergies Review of Systems  All other systems reviewed and are negative.  Objective:  There were no vitals filed for this visit.  General: Well developed, nourished, in no acute distress, alert and oriented x3   Dermatological: Skin is warm, dry and supple bilateral. Nails x 10 are well maintained; remaining integument appears unremarkable at this time. There are no open sores, no preulcerative lesions, no rash or signs of infection present.  Vascular: Dorsalis Pedis artery and Posterior Tibial artery pedal pulses are 2/4 bilateral with immedate capillary fill time. Pedal hair growth present. No varicosities and no lower extremity edema present bilateral.    Neruologic: Grossly intact via light touch bilateral. Vibratory intact via tuning fork bilateral. Protective threshold with Semmes Wienstein monofilament intact to all pedal sites bilateral. Patellar and Achilles deep tendon reflexes 2+ bilateral. No Babinski or clonus noted bilateral.   Musculoskeletal: No gross boney pedal deformities bilateral. No pain, crepitus, or limitation noted with foot and ankle range of motion bilateral. Muscular strength 5/5 in all groups tested bilateral.  Gait: Unassisted, Nonantalgic.    Radiographs:  None taken  Assessment & Plan:   Assessment: Painful hallux nail possible nail dystrophy cannot rule out onychomycosis hallux left.  Plan: After thorough discussion today we decided to remove the nail in total to sent for pathologic evaluation.  This was performed after 3 cc of a 50-50 mixture of Marcaine plain lidocaine plain was infiltrated in a hallux block.  She tolerated procedure well without complications.  The nail was removed placed on the back table to be sent for pathologic evaluation.  She will start soaking twice daily Betadine and warm water.  She was provided both oral and written home-going instructions as well as a prescription for Cortisporin Otic.  I will follow-up with her in about 3 weeks at which time we hope to have the results back.     Max T. MantecaHyatt, [place] Dakota[place]

## 2017-11-21 NOTE — Progress Notes (Signed)
Patient has follow up appt scheduled for 11/27/17 with Dr. Al CorpusHyatt

## 2017-11-23 ENCOUNTER — Ambulatory Visit: Payer: Self-pay | Admitting: Podiatry

## 2017-12-07 ENCOUNTER — Encounter: Payer: Self-pay | Admitting: Podiatry

## 2017-12-07 ENCOUNTER — Ambulatory Visit (INDEPENDENT_AMBULATORY_CARE_PROVIDER_SITE_OTHER): Payer: BLUE CROSS/BLUE SHIELD | Admitting: Podiatry

## 2017-12-07 DIAGNOSIS — L603 Nail dystrophy: Secondary | ICD-10-CM

## 2017-12-07 DIAGNOSIS — Z79899 Other long term (current) drug therapy: Secondary | ICD-10-CM | POA: Diagnosis not present

## 2017-12-07 DIAGNOSIS — L600 Ingrowing nail: Secondary | ICD-10-CM

## 2017-12-07 MED ORDER — TERBINAFINE HCL 250 MG PO TABS
250.0000 mg | ORAL_TABLET | Freq: Every day | ORAL | 0 refills | Status: DC
Start: 2017-12-07 — End: 2018-01-09

## 2017-12-07 NOTE — Progress Notes (Signed)
She presents today for follow-up of her nail avulsion hallux left.  She states that is doing just great without any complications product.  Objective: No erythema edema cellulitis drainage or odor toe appears to be healing well.  There is no pain.  Pathology results do demonstrate onychomycosis.  Assessment: Onychomycosis well-healing surgical toe.  Plan: At this point we will go ahead and get her started on Lamisil therapy and request liver profile.  We discussed pros and cons of use of Lamisil today she understands that and is amenable to it follow-up with me in 1 month.

## 2017-12-07 NOTE — Patient Instructions (Signed)

## 2017-12-08 LAB — HEPATIC FUNCTION PANEL
ALBUMIN: 4.5 g/dL (ref 3.5–5.5)
ALK PHOS: 86 IU/L (ref 39–117)
ALT: 24 IU/L (ref 0–32)
AST: 21 IU/L (ref 0–40)
BILIRUBIN, DIRECT: 0.12 mg/dL (ref 0.00–0.40)
Bilirubin Total: 0.4 mg/dL (ref 0.0–1.2)
TOTAL PROTEIN: 7.5 g/dL (ref 6.0–8.5)

## 2017-12-09 ENCOUNTER — Telehealth: Payer: Self-pay | Admitting: *Deleted

## 2017-12-09 NOTE — Telephone Encounter (Signed)
-----   Message from Elinor ParkinsonMax T Hyatt, [place] sent at 12/08/2017  6:43 AM EDT ----- Blood work looks great and may continue medication.

## 2017-12-09 NOTE — Telephone Encounter (Signed)
I informed pt of Dr. Hyatt's review of results and orders. 

## 2018-01-09 ENCOUNTER — Ambulatory Visit (INDEPENDENT_AMBULATORY_CARE_PROVIDER_SITE_OTHER): Payer: BLUE CROSS/BLUE SHIELD | Admitting: Podiatry

## 2018-01-09 ENCOUNTER — Encounter: Payer: Self-pay | Admitting: Podiatry

## 2018-01-09 DIAGNOSIS — L603 Nail dystrophy: Secondary | ICD-10-CM

## 2018-01-09 DIAGNOSIS — Z79899 Other long term (current) drug therapy: Secondary | ICD-10-CM

## 2018-01-09 MED ORDER — TERBINAFINE HCL 250 MG PO TABS
250.0000 mg | ORAL_TABLET | Freq: Every day | ORAL | 0 refills | Status: DC
Start: 2018-01-09 — End: 2019-07-09

## 2018-01-09 NOTE — Progress Notes (Signed)
She presents today for follow-up of her Lamisil therapy she denies fever chills nausea vomiting muscle aches pains rashes or itching.  No stomach upset no headache.  Objective: Stable alert and oriented x3.  Pulses are palpable.  No change in the nail plate hallux left as of yet.  Assessment: Long-term therapy for onychomycosis with Lamisil.  Plan: Requested a liver profile today and wrote a prescription for 90 tablets and I will follow-up with her in 4 months.  Should her blood work come back abnormal I will notify her immediately.

## 2018-01-11 ENCOUNTER — Telehealth: Payer: Self-pay

## 2018-01-11 LAB — HEPATIC FUNCTION PANEL
ALBUMIN: 4.5 g/dL (ref 3.5–5.5)
ALT: 27 IU/L (ref 0–32)
AST: 23 IU/L (ref 0–40)
Alkaline Phosphatase: 80 IU/L (ref 39–117)
Bilirubin Total: 0.5 mg/dL (ref 0.0–1.2)
Bilirubin, Direct: 0.14 mg/dL (ref 0.00–0.40)
Total Protein: 7.3 g/dL (ref 6.0–8.5)

## 2018-01-11 NOTE — Telephone Encounter (Signed)
-----   Message from Kristian Covey, [facility] sent at 01/11/2018  1:48 PM EDT ----- Lab WNL - okay to continue with medication

## 2018-01-11 NOTE — Telephone Encounter (Signed)
Patient has been contacted and informed of normal lab results and that she can resume Terbinafine as directed.

## 2018-05-17 ENCOUNTER — Ambulatory Visit: Payer: BLUE CROSS/BLUE SHIELD | Admitting: Podiatry

## 2018-06-12 ENCOUNTER — Ambulatory Visit: Payer: BLUE CROSS/BLUE SHIELD | Admitting: Podiatry

## 2019-06-22 ENCOUNTER — Encounter: Payer: Self-pay | Admitting: Gastroenterology

## 2019-06-26 ENCOUNTER — Encounter: Payer: Self-pay | Admitting: *Deleted

## 2019-06-26 NOTE — Telephone Encounter (Signed)
error 

## 2019-07-09 ENCOUNTER — Ambulatory Visit (AMBULATORY_SURGERY_CENTER): Payer: BC Managed Care – PPO | Admitting: *Deleted

## 2019-07-09 ENCOUNTER — Other Ambulatory Visit: Payer: Self-pay

## 2019-07-09 VITALS — Temp 97.1°F | Ht 65.0 in | Wt 237.0 lb

## 2019-07-09 DIAGNOSIS — Z800 Family history of malignant neoplasm of digestive organs: Secondary | ICD-10-CM

## 2019-07-09 DIAGNOSIS — Z1159 Encounter for screening for other viral diseases: Secondary | ICD-10-CM

## 2019-07-09 MED ORDER — NA SULFATE-K SULFATE-MG SULF 17.5-3.13-1.6 GM/177ML PO SOLN: ORAL | 0 refills | Status: DC

## 2019-07-09 NOTE — Progress Notes (Signed)
Patient is here in-person for PV. Patient denies any allergies to eggs or soy. Patient denies any problems with anesthesia/sedation. Patient denies any oxygen use at home. Patient denies taking any blood thinners. Patient is not being treated for MRSA or C-diff. EMMI education assisgned to the patient for the procedure, this was explained and instructions given to patient. Suprep 15 off coupon given to pt. COVID-19 screening test is on 11/4 955 am, the pt is aware.   Pt is aware that care partner will wait in the car during procedure; if they feel like they will be too hot or cold to wait in the car; they may wait in the 4 th floor lobby. Patient is aware to bring only one care partner. We want them to wear a mask (we do not have any that we can provide them), practice social distancing, and we will check their temperatures when they get here.  I did remind the patient that their care partner needs to stay in the parking lot the entire time and have a cell phone available, we will call them when the pt is ready for discharge. Patient will wear mask into building.

## 2019-07-10 ENCOUNTER — Encounter: Payer: Self-pay | Admitting: Gastroenterology

## 2019-07-18 ENCOUNTER — Other Ambulatory Visit: Payer: Self-pay | Admitting: Gastroenterology

## 2019-07-18 LAB — SARS CORONAVIRUS 2 (TAT 6-24 HRS): SARS Coronavirus 2: NEGATIVE

## 2019-07-23 ENCOUNTER — Other Ambulatory Visit: Payer: Self-pay

## 2019-07-23 ENCOUNTER — Ambulatory Visit (AMBULATORY_SURGERY_CENTER): Payer: BC Managed Care – PPO | Admitting: Gastroenterology

## 2019-07-23 ENCOUNTER — Encounter: Payer: Self-pay | Admitting: Gastroenterology

## 2019-07-23 VITALS — BP 127/74 | HR 79 | Temp 98.4°F | Resp 18 | Ht 65.0 in | Wt 237.0 lb

## 2019-07-23 DIAGNOSIS — Z800 Family history of malignant neoplasm of digestive organs: Secondary | ICD-10-CM | POA: Diagnosis present

## 2019-07-23 DIAGNOSIS — Z1211 Encounter for screening for malignant neoplasm of colon: Secondary | ICD-10-CM | POA: Diagnosis not present

## 2019-07-23 LAB — HM COLONOSCOPY

## 2019-07-23 MED ORDER — SODIUM CHLORIDE 0.9 % IV SOLN: 500.0000 mL | Freq: Once | INTRAVENOUS | Status: DC

## 2019-07-23 NOTE — Op Note (Signed)
[facility] Patient Name: Lisa Tyler Procedure Date: 07/23/2019 11:41 AM MRN: [id number] Endoscopist: [place]. Loletha Carrow , MD Age: 53 Referring MD:  Date of Birth: 08/20/1966 Gender: Female Account #: [id number] Procedure:                Colonoscopy Indications:              Screening in patient at increased risk: Colorectal                            cancer in father before age 70 ( no polyps last                            colonoscopy 09/2012) Medicines:                Monitored Anesthesia Care Procedure:                Pre-Anesthesia Assessment:                           - Prior to the procedure, a History and Physical                            was performed, and patient medications and                            allergies were reviewed. The patient's tolerance of                            previous anesthesia was also reviewed. The risks                            and benefits of the procedure and the sedation                            options and risks were discussed with the patient.                            All questions were answered, and informed consent                            was obtained. Prior Anticoagulants: The patient has                            taken no previous anticoagulant or antiplatelet                            agents. ASA Grade Assessment: II - A patient with                            mild systemic disease. After reviewing the risks                            and benefits, the patient was deemed in  satisfactory condition to undergo the procedure.                           After obtaining informed consent, the colonoscope                            was passed under direct vision. Throughout the                            procedure, the patient's blood pressure, pulse, and                            oxygen saturations were monitored continuously. The                            Colonoscope was introduced through the  anus and                            advanced to the the cecum, identified by                            appendiceal orifice and ileocecal valve. The                            colonoscopy was performed without difficulty. The                            patient tolerated the procedure well. The quality                            of the bowel preparation was good. The ileocecal                            valve, appendiceal orifice, and rectum were                            photographed. Scope In: 11:51:29 AM Scope Out: 12:02:00 PM Scope Withdrawal Time: 0 hours 7 minutes 29 seconds  Total Procedure Duration: 0 hours 10 minutes 31 seconds  Findings:                 The perianal and digital rectal examinations were                            normal.                           Many diverticula were found in the left colon and                            right colon.                           The exam was otherwise without abnormality on  direct and retroflexion views. Complications:            No immediate complications. Estimated Blood Loss:     Estimated blood loss: none. Impression:               - Diverticulosis in the left colon and in the right                            colon.                           - The examination was otherwise normal on direct                            and retroflexion views.                           - No specimens collected. Recommendation:           - Patient has a contact number available for                            emergencies. The signs and symptoms of potential                            delayed complications were discussed with the                            patient. Return to normal activities tomorrow.                            Written discharge instructions were provided to the                            patient.                           - Resume previous diet.                           - Continue present  medications.                           - Repeat colonoscopy in 5 years for screening                            purposes. Harris Penton L. Myrtie Neitheranis, MD 07/23/2019 12:04:51 PM This report has been signed electronically.

## 2019-07-23 NOTE — Progress Notes (Signed)
Pt tolerated well. VSS. arousable to recovery. 

## 2019-07-23 NOTE — Progress Notes (Signed)
Temp check by:LC Vital check by:CW  The patient states no changes in medical or surgical history since pre-visit screening on 07/09/2019.

## 2019-07-23 NOTE — Patient Instructions (Signed)
YOU HAD AN ENDOSCOPIC PROCEDURE TODAY AT THE [facility]:   Refer to the procedure report that was given to you for any specific questions about what was found during the examination.  If the procedure report does not answer your questions, please call your gastroenterologist to clarify.  If you requested that your care partner not be given the details of your procedure findings, then the procedure report has been included in a sealed envelope for you to review at your convenience later.  YOU SHOULD EXPECT: Some feelings of bloating in the abdomen. Passage of more gas than usual.  Walking can help get rid of the air that was put into your GI tract during the procedure and reduce the bloating. If you had a lower endoscopy (such as a colonoscopy or flexible sigmoidoscopy) you may notice spotting of blood in your stool or on the toilet paper. If you underwent a bowel prep for your procedure, you may not have a normal bowel movement for a few days.  Please Note:  You might notice some irritation and congestion in your nose or some drainage.  This is from the oxygen used during your procedure.  There is no need for concern and it should clear up in a day or so.  SYMPTOMS TO REPORT IMMEDIATELY:   Following lower endoscopy (colonoscopy or flexible sigmoidoscopy):  Excessive amounts of blood in the stool  Significant tenderness or worsening of abdominal pains  Swelling of the abdomen that is new, acute  Fever of 100F or higher   For urgent or emergent issues, a gastroenterologist can be reached at any hour by calling [phone].   DIET:  We do recommend a small meal at first, but then you may proceed to your regular diet.  Drink plenty of fluids but you should avoid alcoholic beverages for 24 hours.  MEDICATIONS: Continue present medications.  Please see handouts given to you by your recovery nurse.  ACTIVITY:  You should plan to take it easy for the rest of today and you should  NOT DRIVE or use heavy machinery until tomorrow (because of the sedation medicines used during the test).    FOLLOW UP: Our staff will call the number listed on your records 48-72 hours following your procedure to check on you and address any questions or concerns that you may have regarding the information given to you following your procedure. If we do not reach you, we will leave a message.  We will attempt to reach you two times.  During this call, we will ask if you have developed any symptoms of COVID 19. If you develop any symptoms (ie: fever, flu-like symptoms, shortness of breath, cough etc.) before then, please call [phone].  If you test positive for Covid 19 in the 2 weeks post procedure, please call and report this information to us.    If any biopsies were taken you will be contacted by phone or by letter within the next 1-3 weeks.  Please call us at [phone] if you have not heard about the biopsies in 3 weeks.   Thank you for allowing us to provide for your healthcare needs today.   SIGNATURES/CONFIDENTIALITY: You and/or your care partner have signed paperwork which will be entered into your electronic medical record.  These signatures attest to the fact that that the information above on your After Visit Summary has been reviewed and is understood.  Full responsibility of the confidentiality of this discharge information lies with you and/or   your care-partner. 

## 2019-07-25 ENCOUNTER — Telehealth: Payer: Self-pay | Admitting: *Deleted

## 2019-07-25 NOTE — Telephone Encounter (Signed)
  Follow up Call-  Call back number 07/23/2019  Post procedure Call Back phone  # [phone]  Permission to leave phone message Yes  Some recent data might be hidden     Patient questions:  Do you have a fever, pain , or abdominal swelling? No. Pain Score  0 *  Have you tolerated food without any problems? Yes.    Have you been able to return to your normal activities? Yes.    Do you have any questions about your discharge instructions: Diet   No. Medications  No. Follow up visit  No.  Do you have questions or concerns about your Care? No.  Actions: * If pain score is 4 or above: No action needed, pain <4.  1. Have you developed a fever since your procedure? no  2.   Have you had an respiratory symptoms (SOB or cough) since your procedure? no  3.   Have you tested positive for COVID 19 since your procedure no  4.   Have you had any family members/close contacts diagnosed with the COVID 19 since your procedure?  no   If yes to any of these questions please route to Joylene John, RN and Alphonsa Gin, Therapist, sports.

## 2020-01-10 ENCOUNTER — Ambulatory Visit: Payer: BC Managed Care – PPO | Admitting: Internal Medicine

## 2020-02-13 ENCOUNTER — Ambulatory Visit: Payer: Self-pay | Admitting: Internal Medicine

## 2020-03-24 ENCOUNTER — Encounter: Payer: Self-pay | Admitting: Internal Medicine

## 2020-03-24 ENCOUNTER — Ambulatory Visit (INDEPENDENT_AMBULATORY_CARE_PROVIDER_SITE_OTHER): Payer: Self-pay | Admitting: Internal Medicine

## 2020-03-24 ENCOUNTER — Other Ambulatory Visit: Payer: Self-pay

## 2020-03-24 DIAGNOSIS — I10 Essential (primary) hypertension: Secondary | ICD-10-CM

## 2020-03-24 DIAGNOSIS — K219 Gastro-esophageal reflux disease without esophagitis: Secondary | ICD-10-CM | POA: Insufficient documentation

## 2020-03-24 NOTE — Patient Instructions (Signed)

## 2020-03-24 NOTE — Assessment & Plan Note (Signed)
Avoid triggers Continue Famotidine as needed Will d/c Omeprazole Encouraged weight loss

## 2020-03-24 NOTE — Progress Notes (Signed)
HPI  Pt presents to the clinic today to establish care and for management of the conditions listed below. Lisa Tyler is transferring care from [facility].   HTN: Her BP today is 122/84. Lisa Tyler has been out of her HCTZ. Lisa Tyler has not noticed an increase in swelling. ECG from 01/2013 reviewed.  GERD: Triggered by spicy foods, chocolate. Lisa Tyler has been out of her Omeprazole but will take Famotidine 1-2 times per week. Upper GI from 09/2003 reviewed.  Flu: 06/2019 Tetanus: > 10 years ago Covid: 11/2019, 12/2019 Pap Smear: 2019 [facility]: [facility] Colon Screening: 07/2019 Vision Screening: annually Dentist: biannually  Past Medical History:  Diagnosis Date  . Diverticulitis of colon with perforation 08/22/2012  . Elevated liver enzymes 01-24-13   4 weeks ago very elevated  . GERD (gastroesophageal reflux disease)   . Hypertension     Current Outpatient Medications  Medication Sig Dispense Refill  . APPLE CIDER VINEGAR PO Take by mouth.    . Ergocalciferol (VITAMIN D2 PO) Take by mouth once a week.    Marland Kitchen FLUZONE QUADRIVALENT 0.5 ML injection TO BE ADMINISTERED BY PHARMACIST FOR IMMUNIZATION  0  . hydrochlorothiazide (HYDRODIURIL) 25 MG tablet Take 25 mg by mouth daily.  1  . KRILL OIL PO Take by mouth.    . Multiple Vitamin (MULTIVITAMIN) tablet Take 1 tablet by mouth daily.    Marland Kitchen omeprazole (PRILOSEC) 20 MG capsule omeprazole 20 mg capsule,delayed release  TAKE 1 CAPSULE BY MOUTH EVERY DAY    . phentermine (ADIPEX-P) 37.5 MG tablet phentermine 37.5 mg tablet  TAKE 1/2 TO 1 TABLET BY MOUTH EVERY DAY    . Probiotic Product (PROBIOTIC DAILY PO) Take by mouth.    . TURMERIC CURCUMIN PO Take by mouth.     Current Facility-Administered Medications  Medication Dose Route Frequency Provider Last Rate Last Admin  . 0.9 %  sodium chloride infusion  500 mL Intravenous Once Sherrilyn Rist, MD        No Known Allergies  Family History  Problem Relation Age of Onset  .  Crohn's disease Cousin        Maternal cousin  . Breast cancer Maternal Aunt   . Colon cancer Father        76's  . Diabetes Maternal Grandmother   . Colitis Maternal Grandmother   . Colon polyps Neg Hx   . Esophageal cancer Neg Hx   . Rectal cancer Neg Hx   . Stomach cancer Neg Hx     Social History   Socioeconomic History  . Marital status: Married    Spouse name: Not on file  . Number of children: 3  . Years of education: Not on file  . Highest education level: Not on file  Occupational History  . Occupation: Educational psychologist    Comment: self employed  Tobacco Use  . Smoking status: Never Smoker  . Smokeless tobacco: Never Used  Vaping Use  . Vaping Use: Never used  Substance and Sexual Activity  . Alcohol use: No  . Drug use: No  . Sexual activity: Yes  Other Topics Concern  . Not on file  Social History Narrative  . Not on file   Social Determinants of Health   Financial Resource Strain:   . Difficulty of Paying Living Expenses:   Food Insecurity:   . Worried About Programme researcher, broadcasting/film/video in the Last Year:   . [employer] of Food in the Last Year:  Transportation Needs:   . Freight forwarder (Medical):   Marland Kitchen Lack of Transportation (Non-Medical):   Physical Activity:   . Days of Exercise per Week:   . Minutes of Exercise per Session:   Stress:   . Feeling of Stress :   Social Connections:   . Frequency of Communication with Friends and Family:   . Frequency of Social Gatherings with Friends and Family:   . Attends Religious Services:   . Active Member of Clubs or Organizations:   . Attends Banker Meetings:   Marland Kitchen Marital Status:   Intimate Partner Violence:   . Fear of Current or Ex-Partner:   . Emotionally Abused:   Marland Kitchen Physically Abused:   . Sexually Abused:     ROS:  Constitutional: Denies fever, malaise, fatigue, headache or abrupt weight changes.  HEENT: Denies eye pain, eye redness, ear pain, ringing in the ears, wax buildup, runny  nose, nasal congestion, bloody nose, or sore throat. Respiratory: Denies difficulty breathing, shortness of breath, cough or sputum production.   Cardiovascular: Denies chest pain, chest tightness, palpitations or swelling in the hands or feet.  Gastrointestinal: Pt reports intermittent reflux. Denies abdominal pain, bloating, constipation, diarrhea or blood in the stool.  GU: Denies frequency, urgency, pain with urination, blood in urine, odor or discharge. Musculoskeletal: Denies decrease in range of motion, difficulty with gait, muscle pain or joint pain and swelling.  Skin: Denies redness, rashes, lesions or ulcercations.  Neurological: Denies dizziness, difficulty with memory, difficulty with speech or problems with balance and coordination.  Psych: Denies anxiety, depression, SI/HI.  No other specific complaints in a complete review of systems (except as listed in HPI above).  PE:  BP 122/84   Pulse 85   Temp 97.7 F (36.5 C) (Temporal)   Ht 5' 3.25" (1.607 m)   Wt 215 lb (97.5 kg)   LMP 01/25/2011   SpO2 98%   BMI 37.79 kg/m   Wt Readings from Last 3 Encounters:  07/23/19 237 lb (107.5 kg)  07/09/19 237 lb (107.5 kg)  05/29/14 224 lb (101.6 kg)    General: Appears her stated age, obese, in NAD. Neck: Neck supple, trachea midline. No masses, lumps or thyromegaly present.  Cardiovascular: Normal rate and rhythm. S1,S2 noted.  No murmur, rubs or gallops noted. No JVD or BLE edema.  Pulmonary/Chest: Normal effort and positive vesicular breath sounds. No respiratory distress. No wheezes, rales or ronchi noted.  Abdomen: Soft and nontender. Normal bowel sounds. Neurological: Alert and oriented. Cranial nerves II-XII grossly intact. Coordination normal.  Psychiatric: Mood and affect normal. Behavior is normal. Judgment and thought content normal.    BMET    Component Value Date/Time   NA 141 01/24/2013 0930   K 4.6 01/24/2013 0930   CL 105 01/24/2013 0930   CO2 30  01/24/2013 0930   GLUCOSE 92 01/24/2013 0930   BUN 13 01/24/2013 0930   CREATININE 0.82 01/24/2013 0930   CALCIUM 9.7 01/24/2013 0930   GFRNONAA 86 (L) 01/24/2013 0930   GFRAA >90 01/24/2013 0930    Lipid Panel  No results found for: CHOL, TRIG, HDL, CHOLHDL, VLDL, LDLCALC  CBC    Component Value Date/Time   WBC 4.5 01/24/2013 0930   RBC 4.71 01/24/2013 0930   HGB 14.0 01/24/2013 0930   HCT 41.5 01/24/2013 0930   PLT 210 01/24/2013 0930   MCV 88.1 01/24/2013 0930   MCH 29.7 01/24/2013 0930   MCHC 33.7 01/24/2013 0930   RDW  12.3 01/24/2013 0930   LYMPHSABS 2.4 07/16/2012 1959   MONOABS 0.8 07/16/2012 1959   EOSABS 0.1 07/16/2012 1959   BASOSABS 0.0 07/16/2012 1959    Hgb A1C No results found for: HGBA1C   Assessment and Plan:

## 2020-03-24 NOTE — Assessment & Plan Note (Signed)
Controlled off meds Will d/c HCTZ Reinforced DASH diet and exercise for weight loss

## 2020-07-30 ENCOUNTER — Ambulatory Visit (INDEPENDENT_AMBULATORY_CARE_PROVIDER_SITE_OTHER): Payer: 59 | Admitting: Podiatry

## 2020-07-30 ENCOUNTER — Encounter: Payer: Self-pay | Admitting: Podiatry

## 2020-07-30 ENCOUNTER — Other Ambulatory Visit: Payer: Self-pay

## 2020-07-30 DIAGNOSIS — R200 Anesthesia of skin: Secondary | ICD-10-CM | POA: Diagnosis not present

## 2020-07-30 DIAGNOSIS — L603 Nail dystrophy: Secondary | ICD-10-CM

## 2020-07-30 DIAGNOSIS — M2041 Other hammer toe(s) (acquired), right foot: Secondary | ICD-10-CM

## 2020-07-30 MED ORDER — NEOMYCIN-POLYMYXIN-HC 3.5-10000-1 OT SUSP
OTIC | 0 refills | Status: DC
Start: 2020-07-30 — End: 2021-04-02

## 2020-07-30 NOTE — Patient Instructions (Signed)

## 2020-07-30 NOTE — Progress Notes (Signed)
  Subjective:  Patient ID: Lisa Tyler, female    DOB: Mar 21, 1966,  MRN: [id number]  Chief Complaint  Patient presents with  . Nail Problem    Patient presents today for bilat discolored great toenails that are painful, more so at night and aches.      54 y.o. female presents with the above complaint. History confirmed with patient.  Has a history of nail fungus, she was treated for this by Dr. Al Corpus with Lamisil.  She also had an avulsion of the nail plate of the left hallux but it regrew very thick and painful.  Both her and discomfort when she would like them removed permanently  Objective:  Physical Exam: warm, good capillary refill, no trophic changes or ulcerative lesions, normal DP and PT pulses and normal sensory exam.  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 Nail dystrophy      Plan:  Patient was evaluated and treated and all questions answered.    Dystrophic Nail, bilaterally -Patient elects to proceed with minor surgery to remove dystrophic toenail today. Consent reviewed and signed by patient. -Dystrophic nails excised. See procedure note. -Educated on post-procedure care including soaking. Written instructions provided and reviewed. -Patient to follow up in 2 weeks for nail check.  Procedure: Excision of dystrophic toenail Location: Bilateral hallux nail . Anesthesia: Lidocaine 1% plain; 1.5 mL and Marcaine 0.5% plain; 1.5 mL, digital block. Skin Prep: Betadine. Dressing: Silvadene; telfa; dry, sterile, compression dressing. Technique: Following skin prep, the toe was exsanguinated and a tourniquet was secured at the base of the toe. The affected nail was freed and excised. Chemical matrixectomy was then performed with phenol and irrigated out with alcohol. The tourniquet was then removed and sterile dressing applied. Disposition: Patient tolerated procedure well. Patient to return in 2 weeks for  follow-up.   -She also mentioned that she has some numbness in the right fourth and fifth toes.  On exam unable to palpate a Mulder's click or reproduce any of the symptoms.  Is possible this could be a very small neuroma or secondary to hammertoes.  We will discuss further at her next visit  No follow-ups on file.

## 2020-08-13 ENCOUNTER — Other Ambulatory Visit: Payer: Self-pay

## 2020-08-13 ENCOUNTER — Encounter: Payer: Self-pay | Admitting: Podiatry

## 2020-08-13 ENCOUNTER — Ambulatory Visit (INDEPENDENT_AMBULATORY_CARE_PROVIDER_SITE_OTHER): Payer: 59 | Admitting: Podiatry

## 2020-08-13 DIAGNOSIS — L603 Nail dystrophy: Secondary | ICD-10-CM

## 2020-08-14 NOTE — Progress Notes (Signed)
  Subjective:  Patient ID: Lisa Tyler, female    DOB: [birth year],  MRN: [id number]  Chief Complaint  Patient presents with  . Nail Problem    "they are still really sore and tender, the right tends to bleed more"    54 y.o. female returns with the above complaint. History confirmed with patient.   Objective:  Physical Exam: warm, good capillary refill, no trophic changes or ulcerative lesions, normal DP and PT pulses and normal sensory exam.  Avulsion matricectomy sites are healing well with some drainage.  No signs of infection.  Assessment:   No diagnosis found.   Plan:  Patient was evaluated and treated and all questions answered.    Dystrophic Nail, bilaterally -Nail is healing well.  She can begin to leave open to air and discontinue soaks and ointment -Advised the drainage may continue as it is likely reaction from the phenol.  Should resolve in 2 to 4 weeks  Return if symptoms worsen or fail to improve.

## 2021-04-02 ENCOUNTER — Ambulatory Visit (INDEPENDENT_AMBULATORY_CARE_PROVIDER_SITE_OTHER): Payer: 59 | Admitting: Internal Medicine

## 2021-04-02 ENCOUNTER — Other Ambulatory Visit: Payer: Self-pay

## 2021-04-02 ENCOUNTER — Encounter: Payer: Self-pay | Admitting: Internal Medicine

## 2021-04-02 VITALS — BP 135/85 | HR 64 | Temp 97.7°F | Resp 17 | Ht 63.25 in | Wt 202.0 lb

## 2021-04-02 DIAGNOSIS — Z114 Encounter for screening for human immunodeficiency virus [HIV]: Secondary | ICD-10-CM | POA: Diagnosis not present

## 2021-04-02 DIAGNOSIS — K219 Gastro-esophageal reflux disease without esophagitis: Secondary | ICD-10-CM | POA: Diagnosis not present

## 2021-04-02 DIAGNOSIS — Z1159 Encounter for screening for other viral diseases: Secondary | ICD-10-CM | POA: Diagnosis not present

## 2021-04-02 DIAGNOSIS — Z1231 Encounter for screening mammogram for malignant neoplasm of breast: Secondary | ICD-10-CM | POA: Diagnosis not present

## 2021-04-02 DIAGNOSIS — Z0001 Encounter for general adult medical examination with abnormal findings: Secondary | ICD-10-CM

## 2021-04-02 DIAGNOSIS — Z1211 Encounter for screening for malignant neoplasm of colon: Secondary | ICD-10-CM

## 2021-04-02 DIAGNOSIS — I10 Essential (primary) hypertension: Secondary | ICD-10-CM

## 2021-04-02 DIAGNOSIS — Z6835 Body mass index (BMI) 35.0-35.9, adult: Secondary | ICD-10-CM | POA: Diagnosis not present

## 2021-04-02 NOTE — Progress Notes (Signed)
Subjective:    Patient ID: Lisa Tyler, female    DOB: [birth year], 55 y.o.   MRN: [id number]  HPI  Pt presents to the clinic today for her annual exam. She is also due to follow up chronic conditions.  HTN: Her BP today is 135/85. She is not taking any antihypertensive medications at this time. ECG from 01/2013 reviewed.  GERD: Triggered by tomato based or fried foods. She is not taking any medication OTC for this. Upper GI from 09/2003 reviewed.  Flu: 08/2017 Tetanus: > 10 years ago Shingrix: never Covid: Moderna x 2 Pap Smear: 2 years, [facility] Mammogram: 01/2017 Colon Screening: 07/2019 Vision Screening: annually Dentist: biannually  Diet: She does eat meat. She consumes fruits and veggies. She tries to avoid fried foods. She drinks mostly water. Exercise: Walking  Review of Systems     Past Medical History:  Diagnosis Date   Diverticulitis of colon with perforation 08/22/2012   Elevated liver enzymes 01-24-13   4 weeks ago very elevated   GERD (gastroesophageal reflux disease)    Hypertension     Current Outpatient Medications  Medication Sig Dispense Refill   APPLE CIDER VINEGAR PO Take by mouth.     KRILL OIL PO Take by mouth.     Multiple Vitamin (MULTIVITAMIN) tablet Take 1 tablet by mouth daily.     neomycin-polymyxin-hydrocortisone (CORTISPORIN) 3.5-10000-1 OTIC suspension Apply 1-2 drops daily after soaking and cover with bandaid 10 mL 0   Probiotic Product (PROBIOTIC DAILY PO) Take by mouth.     TURMERIC CURCUMIN PO Take by mouth.     Current Facility-Administered Medications  Medication Dose Route Frequency Provider Last Rate Last Admin   0.9 %  sodium chloride infusion  500 mL Intravenous Once Charlie Pitter III, MD        No Known Allergies  Family History  Problem Relation Age of Onset   Crohn's disease Cousin        Maternal cousin   Breast cancer Maternal Aunt    Colon cancer Father        83's   Diabetes Maternal Grandmother     Colitis Maternal Grandmother    Breast cancer Maternal Grandmother    Hyperlipidemia Maternal Grandmother    Hypertension Maternal Grandmother    Hypertension Mother    Lung cancer Maternal Grandfather    Colon polyps Neg Hx    Esophageal cancer Neg Hx    Rectal cancer Neg Hx    Stomach cancer Neg Hx     Social History   Socioeconomic History   Marital status: Married    Spouse name: Not on file   Number of children: 3   Years of education: Not on file   Highest education level: Not on file  Occupational History   Occupation: Educational psychologist    Comment: self employed  Tobacco Use   Smoking status: Never   Smokeless tobacco: Never  Vaping Use   Vaping Use: Never used  Substance and Sexual Activity   Alcohol use: Yes    Alcohol/week: 1.0 standard drink    Types: 1 Glasses of wine per week    Comment: occasional   Drug use: No   Sexual activity: Yes  Other Topics Concern   Not on file  Social History Narrative   Not on file   Social Determinants of Health   Financial Resource Strain: Not on file  Food Insecurity: Not on file  Transportation Needs: Not on file  Physical Activity: Not on file  Stress: Not on file  Social Connections: Not on file  Intimate Partner Violence: Not on file     Constitutional: Denies fever, malaise, fatigue, headache or abrupt weight changes.  HEENT: Denies eye pain, eye redness, ear pain, ringing in the ears, wax buildup, runny nose, nasal congestion, bloody nose, or sore throat. Respiratory: Denies difficulty breathing, shortness of breath, cough or sputum production.   Cardiovascular: Denies chest pain, chest tightness, palpitations or swelling in the hands or feet.  Gastrointestinal: Pt reports intermittent reflux. Denies abdominal pain, bloating, constipation, diarrhea or blood in the stool.  GU: Denies urgency, frequency, pain with urination, burning sensation, blood in urine, odor or discharge. Musculoskeletal: Denies decrease  in range of motion, difficulty with gait, muscle pain or joint pain and swelling.  Skin: Pt reports scaly skin lesion of chest. Denies redness, rashes, or ulcercations.  Neurological: Denies dizziness, difficulty with memory, difficulty with speech or problems with balance and coordination.  Psych: Denies anxiety, depression, SI/HI.  No other specific complaints in a complete review of systems (except as listed in HPI above).  Objective:   Physical Exam  BP 135/85 (BP Location: Right Arm, Patient Position: Sitting, Cuff Size: Normal)   Pulse 64   Temp 97.7 F (36.5 C) (Temporal)   Resp 17   Ht 5' 3.25" (1.607 m)   Wt 202 lb (91.6 kg)   LMP 01/25/2011   SpO2 100%   BMI 35.50 kg/m    Wt Readings from Last 3 Encounters:  03/24/20 215 lb (97.5 kg)  07/23/19 237 lb (107.5 kg)  07/09/19 237 lb (107.5 kg)    General: Appears her stated age, obese, in NAD. Skin: Warm, dry and intact. Round, raised scaly mole noted of left upper chest. HEENT: Head: normal shape and size; Eyes: sclera white and EOMs intact;  Neck:  Neck supple, trachea midline. No masses, lumps or thyromegaly present.  Cardiovascular: Normal rate and rhythm. S1,S2 noted.  No murmur, rubs or gallops noted. No JVD or BLE edema. No carotid bruits noted. Pulmonary/Chest: Normal effort and positive vesicular breath sounds. No respiratory distress. No wheezes, rales or ronchi noted.  Abdomen: Soft and nontender. Normal bowel sounds. No distention or masses noted. Liver, spleen and kidneys non palpable. Musculoskeletal: Strength 5/5 BUE/BLE. No difficulty with gait.  Neurological: Alert and oriented. Cranial nerves II-XII grossly intact. Coordination normal.  Psychiatric: Mood and affect normal. Behavior is normal. Judgment and thought content normal.     BMET    Component Value Date/Time   NA 141 01/24/2013 0930   K 4.6 01/24/2013 0930   CL 105 01/24/2013 0930   CO2 30 01/24/2013 0930   GLUCOSE 92 01/24/2013 0930    BUN 13 01/24/2013 0930   CREATININE 0.82 01/24/2013 0930   CALCIUM 9.7 01/24/2013 0930   GFRNONAA 86 (L) 01/24/2013 0930   GFRAA >90 01/24/2013 0930    Lipid Panel  No results found for: CHOL, TRIG, HDL, CHOLHDL, VLDL, LDLCALC  CBC    Component Value Date/Time   WBC 4.5 01/24/2013 0930   RBC 4.71 01/24/2013 0930   HGB 14.0 01/24/2013 0930   HCT 41.5 01/24/2013 0930   PLT 210 01/24/2013 0930   MCV 88.1 01/24/2013 0930   MCH 29.7 01/24/2013 0930   MCHC 33.7 01/24/2013 0930   RDW 12.3 01/24/2013 0930   LYMPHSABS 2.4 07/16/2012 1959   MONOABS 0.8 07/16/2012 1959   EOSABS 0.1 07/16/2012 1959   BASOSABS 0.0 07/16/2012 1959  Hgb A1C No results found for: HGBA1C         Assessment & Plan:   Preventative Health Maintenance:  Encouraged her to get a flu shot in the fall She declines tetanus vaccine today She will consider shingrix vaccine Encouraged her to get her Covid booster Pap smear UTD per her report, will request copy Mammogram due- ordered, she will call to schedule Colon screening UTD Encouraged her to consume a balanced diet and exercise regimen Advised her to see an eye doctor and dentist annually Will check CBC, CMET, TSH, Lipid, A1C, HIV and Hep C today  RTC in 1 year, sooner if needed Nicki Reaper, NP This visit occurred during the SARS-CoV-2 public health emergency.  Safety protocols were in place, including screening questions prior to the visit, additional usage of staff PPE, and extensive cleaning of exam room while observing appropriate contact time as indicated for disinfecting solutions.

## 2021-04-02 NOTE — Patient Instructions (Signed)
Health Maintenance, Female Adopting a healthy lifestyle and getting preventive care are important in promoting health and wellness. Ask your health care provider about: The right schedule for you to have regular tests and exams. Things you can do on your own to prevent diseases and keep yourself healthy. What should I know about diet, weight, and exercise? Eat a healthy diet  Eat a diet that includes plenty of vegetables, fruits, low-fat dairy products, and lean protein. Do not eat a lot of foods that are high in solid fats, added sugars, or sodium.  Maintain a healthy weight Body mass index (BMI) is used to identify weight problems. It estimates body fat based on height and weight. Your health care provider can help determineyour BMI and help you achieve or maintain a healthy weight. Get regular exercise Get regular exercise. This is one of the most important things you can do for your health. Most adults should: Exercise for at least 150 minutes each week. The exercise should increase your heart rate and make you sweat (moderate-intensity exercise). Do strengthening exercises at least twice a week. This is in addition to the moderate-intensity exercise. Spend less time sitting. Even light physical activity can be beneficial. Watch cholesterol and blood lipids Have your blood tested for lipids and cholesterol at 53 years of age, then havethis test every 5 years. Have your cholesterol levels checked more often if: Your lipid or cholesterol levels are high. You are older than 53 years of age. You are at high risk for heart disease. What should I know about cancer screening? Depending on your health history and family history, you may need to have cancer screening at various ages. This may include screening for: Breast cancer. Cervical cancer. Colorectal cancer. Skin cancer. Lung cancer. What should I know about heart disease, diabetes, and high blood pressure? Blood pressure and heart  disease High blood pressure causes heart disease and increases the risk of stroke. This is more likely to develop in people who have high blood pressure readings, are of African descent, or are overweight. Have your blood pressure checked: Every 3-5 years if you are 18-39 years of age. Every year if you are 40 years old or older. Diabetes Have regular diabetes screenings. This checks your fasting blood sugar level. Have the screening done: Once every three years after age 40 if you are at a normal weight and have a low risk for diabetes. More often and at a younger age if you are overweight or have a high risk for diabetes. What should I know about preventing infection? Hepatitis B If you have a higher risk for hepatitis B, you should be screened for this virus. Talk with your health care provider to find out if you are at risk forhepatitis B infection. Hepatitis C Testing is recommended for: Everyone born from 1945 through 1965. Anyone with known risk factors for hepatitis C. Sexually transmitted infections (STIs) Get screened for STIs, including gonorrhea and chlamydia, if: You are sexually active and are younger than 53 years of age. You are older than 53 years of age and your health care provider tells you that you are at risk for this type of infection. Your sexual activity has changed since you were last screened, and you are at increased risk for chlamydia or gonorrhea. Ask your health care provider if you are at risk. Ask your health care provider about whether you are at high risk for HIV. Your health care provider may recommend a prescription medicine to help   prevent HIV infection. If you choose to take medicine to prevent HIV, you should first get tested for HIV. You should then be tested every 3 months for as long as you are taking the medicine. Pregnancy If you are about to stop having your period (premenopausal) and you may become pregnant, seek counseling before you get  pregnant. Take 400 to 800 micrograms (mcg) of folic acid every day if you become pregnant. Ask for birth control (contraception) if you want to prevent pregnancy. Osteoporosis and menopause Osteoporosis is a disease in which the bones lose minerals and strength with aging. This can result in bone fractures. If you are 65 years old or older, or if you are at risk for osteoporosis and fractures, ask your health care provider if you should: Be screened for bone loss. Take a calcium or vitamin D supplement to lower your risk of fractures. Be given hormone replacement therapy (HRT) to treat symptoms of menopause. Follow these instructions at home: Lifestyle Do not use any products that contain nicotine or tobacco, such as cigarettes, e-cigarettes, and chewing tobacco. If you need help quitting, ask your health care provider. Do not use street drugs. Do not share needles. Ask your health care provider for help if you need support or information about quitting drugs. Alcohol use Do not drink alcohol if: Your health care provider tells you not to drink. You are pregnant, may be pregnant, or are planning to become pregnant. If you drink alcohol: Limit how much you use to 0-1 drink a day. Limit intake if you are breastfeeding. Be aware of how much alcohol is in your drink. In the [place] one drink equals one 12 oz bottle of beer (355 mL), one 5 oz glass of wine (148 mL), or one 1 oz glass of hard liquor (44 mL). General instructions Schedule regular health, dental, and eye exams. Stay current with your vaccines. Tell your health care provider if: You often feel depressed. You have ever been abused or do not feel safe at home. Summary Adopting a healthy lifestyle and getting preventive care are important in promoting health and wellness. Follow your health care provider's instructions about healthy diet, exercising, and getting tested or screened for diseases. Follow your health care provider's  instructions on monitoring your cholesterol and blood pressure. This information is not intended to replace advice given to you by your health care provider. Make sure you discuss any questions you have with your healthcare provider. Document Revised: 08/23/2018 Document Reviewed: 08/23/2018 Elsevier Patient Education  2022 Elsevier Inc.  

## 2021-04-03 LAB — COMPLETE METABOLIC PANEL WITH GFR
AG Ratio: 1.7 (calc) (ref 1.0–2.5)
ALT: 17 U/L (ref 6–29)
AST: 16 U/L (ref 10–35)
Albumin: 4.5 g/dL (ref 3.6–5.1)
Alkaline phosphatase (APISO): 61 U/L (ref 37–153)
BUN: 17 mg/dL (ref 7–25)
CO2: 28 mmol/L (ref 20–32)
Calcium: 9.6 mg/dL (ref 8.6–10.4)
Chloride: 103 mmol/L (ref 98–110)
Creat: 0.94 mg/dL (ref 0.50–1.03)
Globulin: 2.7 g/dL (calc) (ref 1.9–3.7)
Glucose, Bld: 82 mg/dL (ref 65–139)
Potassium: 4.3 mmol/L (ref 3.5–5.3)
Sodium: 141 mmol/L (ref 135–146)
Total Bilirubin: 0.5 mg/dL (ref 0.2–1.2)
Total Protein: 7.2 g/dL (ref 6.1–8.1)
eGFR: 73 mL/min/{1.73_m2} (ref >=60)

## 2021-04-03 LAB — CBC
HCT: 43.4 % (ref 35.0–45.0)
Hemoglobin: 14.6 g/dL (ref 11.7–15.5)
MCH: 30.3 pg (ref 27.0–33.0)
MCHC: 33.6 g/dL (ref 32.0–36.0)
MCV: 90 fL (ref 80.0–100.0)
MPV: 10.9 fL (ref 7.5–12.5)
Platelets: 236 10*3/uL (ref 140–400)
RBC: 4.82 10*6/uL (ref 3.80–5.10)
RDW: 12.5 % (ref 11.0–15.0)
WBC: 5.6 10*3/uL (ref 3.8–10.8)

## 2021-04-03 LAB — LIPID PANEL
Cholesterol: 231 mg/dL — ABNORMAL HIGH (ref <200)
HDL: 54 mg/dL (ref >=50)
LDL Cholesterol (Calc): 146 mg/dL (calc) — ABNORMAL HIGH
Non-HDL Cholesterol (Calc): 177 mg/dL (calc) — ABNORMAL HIGH (ref <130)
Total CHOL/HDL Ratio: 4.3 (calc) (ref <5.0)
Triglycerides: 169 mg/dL — ABNORMAL HIGH (ref <150)

## 2021-04-03 LAB — TSH: TSH: 1.93 mIU/L

## 2021-04-03 LAB — HEMOGLOBIN A1C
Hgb A1c MFr Bld: 5.2 % of total Hgb (ref <5.7)
Mean Plasma Glucose: 103 mg/dL
eAG (mmol/L): 5.7 mmol/L

## 2021-04-03 LAB — HEPATITIS C ANTIBODY
Hepatitis C Ab: NONREACTIVE
SIGNAL TO CUT-OFF: 0.01 (ref <1.00)

## 2021-04-03 LAB — HIV ANTIBODY (ROUTINE TESTING W REFLEX): HIV 1&2 Ab, 4th Generation: NONREACTIVE

## 2021-04-03 NOTE — Assessment & Plan Note (Signed)
Encouraged diet and exercise for weight loss ?

## 2021-04-03 NOTE — Addendum Note (Signed)
Addended by: Lorre Munroe on: 04/03/2021 08:13 AM   Modules accepted: Orders

## 2021-04-03 NOTE — Assessment & Plan Note (Signed)
Encouraged her to avoid foods that trigger her reflux Encouraged weight loss as this can help reduce reflux symptoms Will monitor

## 2021-04-03 NOTE — Assessment & Plan Note (Signed)
Reasonable control off meds Reinforced DASH diet and exercise for weight loss Will monitor

## 2022-11-24 ENCOUNTER — Encounter: Payer: Self-pay | Admitting: Podiatry

## 2022-11-24 ENCOUNTER — Ambulatory Visit: Payer: 59 | Admitting: Podiatry

## 2022-11-24 DIAGNOSIS — M2012 Hallux valgus (acquired), left foot: Secondary | ICD-10-CM | POA: Diagnosis not present

## 2022-11-24 DIAGNOSIS — M21611 Bunion of right foot: Secondary | ICD-10-CM

## 2022-11-24 DIAGNOSIS — M79671 Pain in right foot: Secondary | ICD-10-CM

## 2022-11-24 DIAGNOSIS — M21612 Bunion of left foot: Secondary | ICD-10-CM | POA: Diagnosis not present

## 2022-11-24 DIAGNOSIS — L603 Nail dystrophy: Secondary | ICD-10-CM | POA: Diagnosis not present

## 2022-11-24 DIAGNOSIS — M2011 Hallux valgus (acquired), right foot: Secondary | ICD-10-CM | POA: Diagnosis not present

## 2022-11-24 DIAGNOSIS — M79672 Pain in left foot: Secondary | ICD-10-CM

## 2022-11-24 DIAGNOSIS — Q828 Other specified congenital malformations of skin: Secondary | ICD-10-CM | POA: Diagnosis not present

## 2022-11-24 NOTE — Progress Notes (Signed)
  Subjective:  Patient ID: Lisa Tyler, female    DOB: 15-Oct-1965,  MRN: [id number]  Chief Complaint  Patient presents with   Callouses    Left foot 2nd - 3rd submet   Bunions    Bilateral - left foot the top of 1st and 2nd toes gets numb sometimes   Nail Problem    Bilateral great toenails were removed several years ago - the rest of her toenails are getting thick and discolored    57 y.o. female presents with the above complaint. History confirmed with patient.  She was previously on Lamisil a couple times for the nail color and thickening, stopped taking because she did not want to continually have her liver checked.  Objective:  Physical Exam: warm, good capillary refill, no trophic changes or ulcerative lesions, normal DP and PT pulses, normal sensory exam, and bilateral she has hallux valgus deformity with abduction of the hallux, no pain on the bunion or in the joint, large porokeratosis submetatarsal 2 left foot, multiple small punctate lesions on the right foot forefoot, thickening and discoloration of multiple toenails the worst of which is the left second and bilateral fifth.  Assessment:   1. Nail dystrophy   2. Porokeratosis   3. Hallux valgus with bunions, left   4. Hallux valgus with bunions, right      Plan:  Patient was evaluated and treated and all questions answered.  Regarding the thickening and discoloration of the nail plates and recommended culture of the nails to see if there is any residual onychomycosis.  We discussed the option of utilizing Jublia and possible laser treatment if there is fungus to avoid further oral antifungal therapy  Regarding the numbness in the big toes this may be contributed to from the bunion deformity we discussed appropriate shoe gear that is not too tight causing pressure on this.  The bunion itself does not hurt so do not think we should consider under taking surgical correction of this at this point.  All symptomatic  hyperkeratoses were safely debrided with a sterile #15 blade to patient's level of comfort without incident. We discussed preventative and palliative care of these lesions including supportive and accommodative shoegear, padding, prefabricated and custom molded accommodative orthoses, use of a pumice stone and lotions/creams daily.  I recommended utilizing 11% salicylic acid cream nightly now that the lesion has been enucleated.  Return if symptoms worsen or fail to improve.

## 2022-11-24 NOTE — Patient Instructions (Signed)
Look for [id number] saliyclic acid cream or medicated pads and use nightly until the lesions go away

## 2023-01-01 ENCOUNTER — Ambulatory Visit
Admission: EM | Admit: 2023-01-01 | Discharge: 2023-01-01 | Disposition: A | Discharge status: Home / Self Care | Payer: 59 | Attending: Family Medicine | Admitting: Family Medicine

## 2023-01-01 ENCOUNTER — Other Ambulatory Visit: Payer: Self-pay

## 2023-01-01 ENCOUNTER — Encounter: Payer: Self-pay | Admitting: Emergency Medicine

## 2023-01-01 DIAGNOSIS — J069 Acute upper respiratory infection, unspecified: Secondary | ICD-10-CM

## 2023-01-01 LAB — POCT INFLUENZA A/B
Influenza A, POC: NEGATIVE
Influenza B, POC: NEGATIVE

## 2023-01-01 MED ORDER — BENZONATATE 200 MG PO CAPS: ORAL_CAPSULE | ORAL | 0 refills | Status: AC

## 2023-01-01 NOTE — ED Triage Notes (Signed)
Pt reports intermittent headache, cough since Wednesday. Pt also reports intermittent upper back pain and chest congestion.

## 2023-01-03 NOTE — ED Provider Notes (Signed)
[facility]   [id number] 01/01/23 Arrival Time: 4098  ASSESSMENT & PLAN:  1. Viral URI with cough    Discussed typical duration of likely viral illness. OTC symptom care as needed. No resp distress.  Results for orders placed or performed during the hospital encounter of 01/01/23  POCT Influenza A/B  Result Value Ref Range   Influenza A, POC Negative Negative   Influenza B, POC Negative Negative    Discharge Medication List as of 01/01/2023  9:09 AM     START taking these medications   Details  benzonatate (TESSALON) 200 MG capsule Take 1 capsule by mouth every 8 (eight) hours for cough., Normal         Follow-up Information     Lorre Munroe, NP.   Specialties: Internal Medicine, Emergency Medicine Why: As needed. Contact information: [address] [place] [address] [phone]         [facility] [place].   Specialty: Urgent Care Why: If worsening or failing to improve as anticipated. Contact information: [address] [place] [address] [phone]                Reviewed expectations re: course of current medical issues. Questions answered. Outlined signs and symptoms indicating need for more acute intervention. Understanding verbalized. After Visit Summary given.   SUBJECTIVE: History from: Patient. Lisa Tyler is a 57 y.o. female. Reports: HA, cough; x 2-3 d days. Chest congestion. Denies: fever and difficulty breathing. Normal PO intake without n/v/d.  OBJECTIVE:  Vitals:   01/01/23 0826  BP: (!) 132/91  Pulse: 80  Resp: 20  Temp: 98.6 F (37 C)  TempSrc: Oral  SpO2: 96%    General appearance: alert; no distress Eyes: PERRLA; EOMI; conjunctiva normal HENT: [place]; AT; with nasal congestion Neck: supple  Lungs: speaks full sentences without difficulty; unlabored; dry cough; ctab Extremities: no edema Skin: warm and dry Neurologic: normal gait Psychological:  alert and cooperative; normal mood and affect  Labs: Results for orders placed or performed during the hospital encounter of 01/01/23  POCT Influenza A/B  Result Value Ref Range   Influenza A, POC Negative Negative   Influenza B, POC Negative Negative   Labs Reviewed  POCT INFLUENZA A/B    Imaging: No results found.  No Known Allergies  Past Medical History:  Diagnosis Date   Diverticulitis of colon with perforation 08/22/2012   Elevated liver enzymes 01-24-13   4 weeks ago very elevated   GERD (gastroesophageal reflux disease)    Hypertension    Social History   Socioeconomic History   Marital status: Married    Spouse name: Not on file   Number of children: 3   Years of education: Not on file   Highest education level: Not on file  Occupational History   Occupation: Educational psychologist    Comment: self employed  Tobacco Use   Smoking status: Never   Smokeless tobacco: Never  Vaping Use   Vaping Use: Never used  Substance and Sexual Activity   Alcohol use: Yes    Alcohol/week: 1.0 standard drink of alcohol    Types: 1 Glasses of wine per week    Comment: occasional   Drug use: No   Sexual activity: Yes  Other Topics Concern   Not on file  Social History Narrative   Not on file   Social Determinants of Health   Financial Resource Strain: Not on file  Food  Insecurity: Not on file  Transportation Needs: Not on file  Physical Activity: Not on file  Stress: Not on file  Social Connections: Not on file  Intimate Partner Violence: Not on file   Family History  Problem Relation Age of Onset   Crohn's disease Cousin        Maternal cousin   Breast cancer Maternal Aunt    Colon cancer Father        61's   Diabetes Maternal Grandmother    Colitis Maternal Grandmother    Breast cancer Maternal Grandmother    Hyperlipidemia Maternal Grandmother    Hypertension Maternal Grandmother    Hypertension Mother    Lung cancer Maternal Grandfather    Colon polyps  Neg Hx    Esophageal cancer Neg Hx    Rectal cancer Neg Hx    Stomach cancer Neg Hx    Past Surgical History:  Procedure Laterality Date   childbirth  01-24-13   x3 -NVD   COLONOSCOPY  09/18/2012   INTRAOPERATIVE CHOLANGIOGRAM N/A 01/25/2013   Procedure: INTRAOPERATIVE CHOLANGIOGRAM;  Surgeon: Ardeth Sportsman, MD;  Location: [facility];  Service: General;  Laterality: N/A;   LAPAROSCOPIC CHOLECYSTECTOMY SINGLE PORT N/A 01/25/2013   Procedure: LAPAROSCOPIC CHOLECYSTECTOMY SINGLE PORT;  Surgeon: Ardeth Sportsman, MD;  Location: [facility];  Service: General;  Laterality: N/A;   UPPER GASTROINTESTINAL ENDOSCOPY       Mardella Layman, MD 01/03/23 [phone]

## 2023-01-24 ENCOUNTER — Ambulatory Visit
Admission: EM | Admit: 2023-01-24 | Discharge: 2023-01-24 | Disposition: A | Discharge status: Home / Self Care | Payer: 59

## 2023-01-24 DIAGNOSIS — J019 Acute sinusitis, unspecified: Secondary | ICD-10-CM

## 2023-01-24 DIAGNOSIS — B9689 Other specified bacterial agents as the cause of diseases classified elsewhere: Secondary | ICD-10-CM | POA: Diagnosis not present

## 2023-01-24 MED ORDER — PROMETHAZINE-DM 6.25-15 MG/5ML PO SYRP: 5.0000 mL | ORAL_SOLUTION | Freq: Every evening | ORAL | 0 refills | Status: AC | PRN

## 2023-01-24 MED ORDER — ONDANSETRON 4 MG PO TBDP: 4.0000 mg | ORAL_TABLET | Freq: Three times a day (TID) | ORAL | 0 refills | Status: AC | PRN

## 2023-01-24 MED ORDER — AMOXICILLIN-POT CLAVULANATE 875-125 MG PO TABS: 1.0000 | ORAL_TABLET | Freq: Two times a day (BID) | ORAL | 0 refills | Status: AC

## 2023-01-24 NOTE — Discharge Instructions (Signed)
You have a bacterial sinus infection.  Please take the Augmentin as prescribed to treat it.  You can use the Zofran every 8 hours as needed for nausea/vomiting.  Symptoms should improve over the next week to 10 days.  If you develop chest pain or shortness of breath, go to the emergency room.  Some things that can make you feel better are: - Increased rest - Increasing fluid with water/sugar free electrolytes - Acetaminophen and ibuprofen as needed for fever/pain - Salt water gargling, chloraseptic spray and throat lozenges for sore throat - OTC guaifenesin (Mucinex) 600 mg twice daily for congestion - Saline sinus flushes or a neti pot - Humidifying the air -Tessalon Perles during the day as needed for dry cough and cough syrup at nighttime as needed for dry cough

## 2023-01-24 NOTE — ED Provider Notes (Signed)
[facility]    CSN: [id number] Arrival date & time: 01/24/23  0844      History   Chief Complaint Chief Complaint  Patient presents with   Otalgia    HPI Lisa Tyler is a 57 y.o. female.   Patient presents today with 2-week history of fever, body aches and chills, congested cough, runny and stuffy nose, frontal sinus pressure, headache when she coughs, right ear pain, nausea and 1 episode of vomiting this morning, and decreased appetite with fatigue.  She denies shortness of breath or chest pain, sore throat, ear drainage, abdominal pain, and diarrhea.  No new rash.  Reports Tmax 2 days ago was 101.8 F.  Has been taking Alka-Seltzer sinus pain with minimal improvement.  Patient denies antibiotic use in the past 90 days.  She also denies allergies to antibiotic therapy.    Past Medical History:  Diagnosis Date   Diverticulitis of colon with perforation 08/22/2012   Elevated liver enzymes 01-24-13   4 weeks ago very elevated   GERD (gastroesophageal reflux disease)    Hypertension     Patient Active Problem List   Diagnosis Date Noted   Class 2 severe obesity due to excess calories with serious comorbidity and body mass index (BMI) of 35.0 to 35.9 in adult (HCC) 04/03/2021   HTN (hypertension) 03/24/2020   GERD (gastroesophageal reflux disease) 03/24/2020    Past Surgical History:  Procedure Laterality Date   childbirth  01-24-13   x3 -NVD   COLONOSCOPY  09/18/2012   INTRAOPERATIVE CHOLANGIOGRAM N/A 01/25/2013   Procedure: INTRAOPERATIVE CHOLANGIOGRAM;  Surgeon: Ardeth Sportsman, MD;  Location: [facility];  Service: General;  Laterality: N/A;   LAPAROSCOPIC CHOLECYSTECTOMY SINGLE PORT N/A 01/25/2013   Procedure: LAPAROSCOPIC CHOLECYSTECTOMY SINGLE PORT;  Surgeon: Ardeth Sportsman, MD;  Location: [facility];  Service: General;  Laterality: N/A;   UPPER GASTROINTESTINAL ENDOSCOPY      OB History   No obstetric history on file.      Home Medications    Prior  to Admission medications   Medication Sig Start Date End Date Taking? Authorizing Provider  amoxicillin-clavulanate (AUGMENTIN) 875-125 MG tablet Take 1 tablet by mouth 2 (two) times daily for 7 days. 01/24/23 01/31/23 Yes Valentino Nose, NP  ondansetron (ZOFRAN-ODT) 4 MG disintegrating tablet Take 1 tablet (4 mg total) by mouth every 8 (eight) hours as needed for nausea or vomiting. 01/24/23  Yes Valentino Nose, NP  promethazine-dextromethorphan (PROMETHAZINE-DM) 6.25-15 MG/5ML syrup Take 5 mLs by mouth at bedtime as needed for cough. Do not take with alcohol or while driving or operating heavy machinery.  May cause drowsiness. 01/24/23  Yes Cathlean Marseilles A, NP  benzonatate (TESSALON) 200 MG capsule Take 1 capsule by mouth every 8 (eight) hours for cough. 01/01/23   Mardella Layman, MD  Multiple Vitamin (MULTIVITAMIN) tablet Take 1 tablet by mouth daily.    [provider]  Probiotic Product (PROBIOTIC DAILY PO) Take by mouth.    [provider]    Family History Family History  Problem Relation Age of Onset   Crohn's disease Cousin        Maternal cousin   Breast cancer Maternal Aunt    Colon cancer Father        60's   Diabetes Maternal Grandmother    Colitis Maternal Grandmother    Breast cancer Maternal Grandmother    Hyperlipidemia Maternal Grandmother    Hypertension Maternal Grandmother    Hypertension Mother  Lung cancer Maternal Grandfather    Colon polyps Neg Hx    Esophageal cancer Neg Hx    Rectal cancer Neg Hx    Stomach cancer Neg Hx     Social History Social History   Tobacco Use   Smoking status: Never   Smokeless tobacco: Never  Vaping Use   Vaping Use: Never used  Substance Use Topics   Alcohol use: Yes    Alcohol/week: 1.0 standard drink of alcohol    Types: 1 Glasses of wine per week    Comment: occasional   Drug use: No     Allergies   Patient has no known allergies.   Review of Systems Review of Systems Per  HPI  Physical Exam Triage Vital Signs ED Triage Vitals  Enc Vitals Group     BP 01/24/23 0935 (!) 141/85     Pulse Rate 01/24/23 0935 (!) 111     Resp 01/24/23 0935 20     Temp 01/24/23 0935 (!) 100.4 F (38 C)     Temp Source 01/24/23 0935 Oral     SpO2 01/24/23 0935 94 %     Weight --      Height --      Head Circumference --      Peak Flow --      Pain Score 01/24/23 0937 0     Pain Loc --      Pain Edu? --      Excl. in GC? --    No data found.  Updated Vital Signs BP (!) 141/85 (BP Location: Right Arm)   Pulse (!) 111   Temp (!) 100.4 F (38 C) (Oral)   Resp 20   LMP 01/25/2011   SpO2 94%   Visual Acuity Right Eye Distance:   Left Eye Distance:   Bilateral Distance:    Right Eye Near:   Left Eye Near:    Bilateral Near:     Physical Exam Vitals and nursing note reviewed.  Constitutional:      General: She is not in acute distress.    Appearance: Normal appearance. She is not ill-appearing or toxic-appearing.  HENT:     Head: Normocephalic and atraumatic.     Right Ear: Tympanic membrane, ear canal and external ear normal.     Left Ear: Tympanic membrane, ear canal and external ear normal.     Nose: Congestion present. No rhinorrhea.     Right Sinus: Frontal sinus tenderness present. No maxillary sinus tenderness.     Left Sinus: Frontal sinus tenderness present. No maxillary sinus tenderness.     Mouth/Throat:     Mouth: Mucous membranes are moist.     Pharynx: Oropharynx is clear. Posterior oropharyngeal erythema present. No oropharyngeal exudate.  Eyes:     General: No scleral icterus.    Extraocular Movements: Extraocular movements intact.  Cardiovascular:     Rate and Rhythm: Normal rate and regular rhythm.  Pulmonary:     Effort: Pulmonary effort is normal. No respiratory distress.     Breath sounds: Normal breath sounds. No wheezing, rhonchi or rales.  Abdominal:     General: Abdomen is flat. Bowel sounds are normal. There is no distension.      Palpations: Abdomen is soft.  Musculoskeletal:     Cervical back: Normal range of motion and neck supple.  Lymphadenopathy:     Cervical: No cervical adenopathy.  Skin:    General: Skin is warm and dry.  Capillary Refill: Capillary refill takes less than 2 seconds.     Coloration: Skin is not jaundiced or pale.     Findings: No erythema or rash.  Neurological:     Mental Status: She is alert and oriented to person, place, and time.  Psychiatric:        Behavior: Behavior is cooperative.      UC Treatments / Results  Labs (all labs ordered are listed, but only abnormal results are displayed) Labs Reviewed - No data to display  EKG   Radiology No results found.  Procedures Procedures (including critical care time)  Medications Ordered in UC Medications - No data to display  Initial Impression / Assessment and Plan / UC Course  I have reviewed the triage vital signs and the nursing notes.  Pertinent labs & imaging results that were available during my care of the patient were reviewed by me and considered in my medical decision making (see chart for details).   Patient is well-appearing, not tachypneic, oxygenating well on room air.  Patient is mildly hypertensive, has a low-grade fever, and is mildly tachycardic today in triage.  1. Acute bacterial sinusitis Treat with Augmentin twice daily for 7 days Supportive care discussed with patient -start cough suppressant at nighttime and antiemetic during the day as needed for nausea/vomiting ER and return precautions also discussed  The patient was given the opportunity to ask questions.  All questions answered to their satisfaction.  The patient is in agreement to this plan.    Final Clinical Impressions(s) / UC Diagnoses   Final diagnoses:  Acute bacterial sinusitis     Discharge Instructions      You have a bacterial sinus infection.  Please take the Augmentin as prescribed to treat it.  You can use the  Zofran every 8 hours as needed for nausea/vomiting.  Symptoms should improve over the next week to 10 days.  If you develop chest pain or shortness of breath, go to the emergency room.  Some things that can make you feel better are: - Increased rest - Increasing fluid with water/sugar free electrolytes - Acetaminophen and ibuprofen as needed for fever/pain - Salt water gargling, chloraseptic spray and throat lozenges for sore throat - OTC guaifenesin (Mucinex) 600 mg twice daily for congestion - Saline sinus flushes or a neti pot - Humidifying the air -Tessalon Perles during the day as needed for dry cough and cough syrup at nighttime as needed for dry cough     ED Prescriptions     Medication Sig Dispense Auth. Provider   amoxicillin-clavulanate (AUGMENTIN) 875-125 MG tablet Take 1 tablet by mouth 2 (two) times daily for 7 days. 14 tablet Cathlean Marseilles A, NP   promethazine-dextromethorphan (PROMETHAZINE-DM) 6.25-15 MG/5ML syrup Take 5 mLs by mouth at bedtime as needed for cough. Do not take with alcohol or while driving or operating heavy machinery.  May cause drowsiness. 118 mL Cathlean Marseilles A, NP   ondansetron (ZOFRAN-ODT) 4 MG disintegrating tablet Take 1 tablet (4 mg total) by mouth every 8 (eight) hours as needed for nausea or vomiting. 20 tablet Valentino Nose, NP      PDMP not reviewed this encounter.   Valentino Nose, NP 01/24/23 1016

## 2023-01-24 NOTE — ED Triage Notes (Addendum)
Pt states she has been having sx for 2 week with sinus headaches cough ear pain  and sinus pressure, then fever started a few days ago along with nausea and vomiting. Loss appetite.

## 2023-08-17 ENCOUNTER — Telehealth: Payer: Self-pay | Admitting: Family Medicine

## 2023-08-17 ENCOUNTER — Ambulatory Visit: Payer: 59 | Admitting: Family Medicine

## 2023-09-29 ENCOUNTER — Ambulatory Visit (INDEPENDENT_AMBULATORY_CARE_PROVIDER_SITE_OTHER): Payer: 59 | Admitting: Family Medicine

## 2023-09-29 ENCOUNTER — Encounter: Payer: Self-pay | Admitting: Family Medicine

## 2023-09-29 VITALS — BP 130/80 | HR 81 | Temp 97.8°F | Ht 63.0 in | Wt 216.0 lb

## 2023-09-29 DIAGNOSIS — K219 Gastro-esophageal reflux disease without esophagitis: Secondary | ICD-10-CM

## 2023-09-29 DIAGNOSIS — Z6838 Body mass index (BMI) 38.0-38.9, adult: Secondary | ICD-10-CM

## 2023-09-29 DIAGNOSIS — Z23 Encounter for immunization: Secondary | ICD-10-CM | POA: Diagnosis not present

## 2023-09-29 DIAGNOSIS — I10 Essential (primary) hypertension: Secondary | ICD-10-CM | POA: Diagnosis not present

## 2023-09-29 DIAGNOSIS — Z1231 Encounter for screening mammogram for malignant neoplasm of breast: Secondary | ICD-10-CM

## 2023-09-29 DIAGNOSIS — E66812 Obesity, class 2: Secondary | ICD-10-CM | POA: Diagnosis not present

## 2023-09-29 NOTE — Assessment & Plan Note (Signed)
Well controlled without medication. TAnti-reflux measures such as raising the head of the bed, avoiding tight clothing or belts, avoiding eating late at night and not lying down shortly after mealtime and achieving weight loss are discussed. Avoid ASA, NSAID's, caffeine, peppermints, alcohol and tobacco. OTC H2 blockers and/or antacids are often very helpful for PRN use. However, for persisting chronic or daily symptoms, prescription strength H2 blockers or a trial of PPI's are often used. She should alert me if there are persistent symptoms, dysphagia, weight loss or GI bleeding.

## 2023-09-29 NOTE — Assessment & Plan Note (Signed)
Well controlled off medication. 130/80 today in office. Recommend heart healthy diet such as Mediterranean diet with whole grains, fruits, vegetable, fish, lean meats, nuts, and olive oil. Limit salt. Encouraged moderate walking, 3-5 times/week for 30-50 minutes each session. Aim for at least 150 minutes.week. Goal should be pace of 3 miles/hours, or walking 1.5 miles in 30 minutes. Avoid tobacco products. Avoid excess alcohol. Take medications as prescribed and bring medications and blood pressure log with cuff to each office visit. Seek medical care for chest pain, palpitations, shortness of breath with exertion, dizziness/lightheadedness, vision changes, recurrent headaches, or swelling of extremities. Follow up 1 year or sooner if needed.

## 2023-09-29 NOTE — Patient Instructions (Signed)
It was great to meet you today and I'm excited to have you join the [facility] practice. I hope you had a positive experience today! If you feel so inclined, please feel free to recommend our practice to friends and family. Mila Merry, FNP-C

## 2023-09-29 NOTE — Assessment & Plan Note (Signed)
Continue weight management. Encouraged low calorie, heart healthy diet and moderate intensity exercise 150 minutes weekly. This is 3-5 times weekly for 30-50 minutes each session. Goal should be pace of 3 miles/hours, or walking 1.5 miles in 30 minutes and include strength training.

## 2023-09-29 NOTE — Progress Notes (Signed)
New Patient Office Visit  Subjective    Patient ID: Lisa Tyler, female    DOB: 11-27-1965  Age: 58 y.o. MRN: [id number]  CC: No chief complaint on file.   HPI Lisa Tyler presents to establish care. Oriented to practice routines and expectations. Has not seen PCP in a few years. PMH includes GERD and HTN. She is working with [employer] [facility] for weight management and has lost 21 pounds. Her diet is high protein, low carb and she is walking for exercise. Lisa Tyler does get her vision checked annually and receives regular dental care.   Breast CA screening: Mammogram status: Completed 01/12/2017. Repeat every year Cervical CA screening: was normal and patient does not recall when last pap was Colon CA screening: colonoscopy 4 years ago with abnormalities.diverticulosis, q5y Tobacco: non-smoker [id number] Vaccines:  tdap and shingles today  HYPERTENSION without Chronic Kidney Disease Hypertension status: controlled  Satisfied with current treatment?  unmedicated Duration of hypertension: chronic BP monitoring frequency:  not checking BP range:  BP medication side effects:   not medicated Medication compliance:  not medicated Previous BP meds:hctz Aspirin: no Recurrent headaches: no Visual changes: no Palpitations: no Dyspnea: no Chest pain: no Lower extremity edema: no Dizzy/lightheaded: no  GERD GERD control status: controlledSatisfied with current treatment? yes Heartburn frequency:  Medication side effects: no  Medication compliance: unmedicated Previous GERD medications: Antacid use frequency:   Duration:  Nature:  Location:  Heartburn duration:  Alleviatiating factors:   Aggravating factors:  Dysphagia: no Odynophagia:  no Hematemesis: no Blood in stool: no EGD: no    Outpatient Encounter Medications as of 09/29/2023  Medication Sig   Multiple Vitamin (MULTIVITAMIN) tablet Take 1 tablet by mouth daily.   Probiotic Product (PROBIOTIC DAILY PO)  Take by mouth.   benzonatate (TESSALON) 200 MG capsule Take 1 capsule by mouth every 8 (eight) hours for cough. (Patient not taking: Reported on 09/29/2023)   ondansetron (ZOFRAN-ODT) 4 MG disintegrating tablet Take 1 tablet (4 mg total) by mouth every 8 (eight) hours as needed for nausea or vomiting. (Patient not taking: Reported on 09/29/2023)   promethazine-dextromethorphan (PROMETHAZINE-DM) 6.25-15 MG/5ML syrup Take 5 mLs by mouth at bedtime as needed for cough. Do not take with alcohol or while driving or operating heavy machinery.  May cause drowsiness. (Patient not taking: Reported on 09/29/2023)   No facility-administered encounter medications on file as of 09/29/2023.    Past Medical History:  Diagnosis Date   Diverticulitis of colon with perforation 08/22/2012   Elevated liver enzymes 01-24-13   4 weeks ago very elevated   GERD (gastroesophageal reflux disease)    Hypertension     Past Surgical History:  Procedure Laterality Date   childbirth  01-24-13   x3 -NVD   COLONOSCOPY  09/18/2012   INTRAOPERATIVE CHOLANGIOGRAM N/A 01/25/2013   Procedure: INTRAOPERATIVE CHOLANGIOGRAM;  Surgeon: Ardeth Sportsman, MD;  Location: [facility];  Service: General;  Laterality: N/A;   LAPAROSCOPIC CHOLECYSTECTOMY SINGLE PORT N/A 01/25/2013   Procedure: LAPAROSCOPIC CHOLECYSTECTOMY SINGLE PORT;  Surgeon: Ardeth Sportsman, MD;  Location: [facility];  Service: General;  Laterality: N/A;   UPPER GASTROINTESTINAL ENDOSCOPY      Family History  Problem Relation Age of Onset   Crohn's disease Cousin        Maternal cousin   Breast cancer Maternal Aunt    Colon cancer Father        46's   Diabetes Maternal Grandmother    Colitis Maternal  Grandmother    Breast cancer Maternal Grandmother    Hyperlipidemia Maternal Grandmother    Hypertension Maternal Grandmother    Hypertension Mother    Lung cancer Maternal Grandfather    Colon polyps Neg Hx    Esophageal cancer Neg Hx    Rectal cancer Neg Hx    Stomach  cancer Neg Hx     Social History   Socioeconomic History   Marital status: Married    Spouse name: Not on file   Number of children: 3   Years of education: Not on file   Highest education level: Some college, no degree  Occupational History   Occupation: Educational psychologist    Comment: self employed  Tobacco Use   Smoking status: Never   Smokeless tobacco: Never  Vaping Use   Vaping status: Never Used  Substance and Sexual Activity   Alcohol use: Yes    Alcohol/week: 1.0 standard drink of alcohol    Types: 1 Glasses of wine per week    Comment: occasional   Drug use: No   Sexual activity: Yes  Other Topics Concern   Not on file  Social History Narrative   Not on file   Social Drivers of Health   Financial Resource Strain: Low Risk  (09/22/2023)   Overall Financial Resource Strain (CARDIA)    Difficulty of Paying Living Expenses: Not hard at all  Food Insecurity: No Food Insecurity (09/22/2023)   Hunger Vital Sign    Worried About Running Out of Food in the Last Year: Never true    Ran Out of Food in the Last Year: Never true  Transportation Needs: No Transportation Needs (09/22/2023)   PRAPARE - Administrator, Civil Service (Medical): No    Lack of Transportation (Non-Medical): No  Physical Activity: Unknown (09/22/2023)   Exercise Vital Sign    Days of Exercise per Week: Patient declined    Minutes of Exercise per Session: 20 min  Recent Concern: Physical Activity - Insufficiently Active (08/10/2023)   Exercise Vital Sign    Days of Exercise per Week: 1 day    Minutes of Exercise per Session: 20 min  Stress: No Stress Concern Present (09/22/2023)   [employer] of Occupational Health - Occupational Stress Questionnaire    Feeling of Stress : Not at all  Social Connections: Unknown (09/22/2023)   Social Connection and Isolation Panel [NHANES]    Frequency of Communication with Friends and Family: Three times a week    Frequency of Social Gatherings with  Friends and Family: More than three times a week    Attends Religious Services: Patient declined    Database administrator or Organizations: Patient declined    Attends Engineer, structural: Not on file    Marital Status: Married  Catering manager Violence: Not on file    Review of Systems  Constitutional: Negative.   HENT: Negative.    Eyes: Negative.   Respiratory: Negative.    Cardiovascular: Negative.   Gastrointestinal: Negative.   Genitourinary: Negative.   Musculoskeletal: Negative.   Skin: Negative.   Neurological: Negative.   Endo/Heme/Allergies: Negative.   Psychiatric/Behavioral: Negative.    All other systems reviewed and are negative.       Objective    BP 130/80   Pulse 81   Temp 97.8 F (36.6 C) (Oral)   Ht 5\' 3"  (1.6 m)   Wt 216 lb (98 kg)   LMP 01/25/2011   SpO2 99%  BMI 38.26 kg/m   Physical Exam Vitals and nursing note reviewed.  Constitutional:      Appearance: Normal appearance. She is obese.  HENT:     Head: Normocephalic and atraumatic.  Cardiovascular:     Rate and Rhythm: Normal rate and regular rhythm.     Pulses: Normal pulses.     Heart sounds: Normal heart sounds.  Pulmonary:     Effort: Pulmonary effort is normal.     Breath sounds: Normal breath sounds.  Skin:    General: Skin is warm and dry.  Neurological:     General: No focal deficit present.     Mental Status: She is alert and oriented to person, place, and time. Mental status is at baseline.  Psychiatric:        Mood and Affect: Mood normal.        Behavior: Behavior normal.        Thought Content: Thought content normal.        Judgment: Judgment normal.         Assessment & Plan:   Problem List Items Addressed This Visit     HTN (hypertension)   Well controlled off medication. 130/80 today in office. Recommend heart healthy diet such as Mediterranean diet with whole grains, fruits, vegetable, fish, lean meats, nuts, and olive oil. Limit salt.  Encouraged moderate walking, 3-5 times/week for 30-50 minutes each session. Aim for at least 150 minutes.week. Goal should be pace of 3 miles/hours, or walking 1.5 miles in 30 minutes. Avoid tobacco products. Avoid excess alcohol. Take medications as prescribed and bring medications and blood pressure log with cuff to each office visit. Seek medical care for chest pain, palpitations, shortness of breath with exertion, dizziness/lightheadedness, vision changes, recurrent headaches, or swelling of extremities. Follow up 1 year or sooner if needed.      Relevant Orders   CBC with Differential/Platelet   COMPLETE METABOLIC PANEL WITH GFR   Lipid panel   GERD (gastroesophageal reflux disease)   Well controlled without medication. TAnti-reflux measures such as raising the head of the bed, avoiding tight clothing or belts, avoiding eating late at night and not lying down shortly after mealtime and achieving weight loss are discussed. Avoid ASA, NSAID's, caffeine, peppermints, alcohol and tobacco. OTC H2 blockers and/or antacids are often very helpful for PRN use. However, for persisting chronic or daily symptoms, prescription strength H2 blockers or a trial of PPI's are often used. She should alert me if there are persistent symptoms, dysphagia, weight loss or GI bleeding.       Relevant Orders   CBC with Differential/Platelet   COMPLETE METABOLIC PANEL WITH GFR   Lipid panel   Class 2 severe obesity with serious comorbidity and body mass index (BMI) of 38.0 to 38.9 in adult [facility]) - Primary   Continue weight management. Encouraged low calorie, heart healthy diet and moderate intensity exercise 150 minutes weekly. This is 3-5 times weekly for 30-50 minutes each session. Goal should be pace of 3 miles/hours, or walking 1.5 miles in 30 minutes and include strength training.      Relevant Orders   CBC with Differential/Platelet   COMPLETE METABOLIC PANEL WITH GFR   Lipid panel   Other Visit Diagnoses        Encounter for screening mammogram for malignant neoplasm of breast       Relevant Orders   MM DIGITAL SCREENING BILATERAL       Return for PAP and annual physical with  labs 1 week prior.   Park Meo, FNP

## 2023-09-30 LAB — CBC WITH DIFFERENTIAL/PLATELET
Absolute Lymphocytes: 1338 {cells}/uL (ref 850–3900)
Absolute Monocytes: 289 {cells}/uL (ref 200–950)
Basophils Absolute: 29 {cells}/uL (ref 0–200)
Basophils Relative: 0.6 %
Eosinophils Absolute: 88 {cells}/uL (ref 15–500)
Eosinophils Relative: 1.8 %
HCT: 44.2 % (ref 35.0–45.0)
Hemoglobin: 14.5 g/dL (ref 11.7–15.5)
MCH: 29.2 pg (ref 27.0–33.0)
MCHC: 32.8 g/dL (ref 32.0–36.0)
MCV: 88.9 fL (ref 80.0–100.0)
MPV: 11.5 fL (ref 7.5–12.5)
Monocytes Relative: 5.9 %
Neutro Abs: 3156 {cells}/uL (ref 1500–7800)
Neutrophils Relative %: 64.4 %
Platelets: 219 10*3/uL (ref 140–400)
RBC: 4.97 10*6/uL (ref 3.80–5.10)
RDW: 13 % (ref 11.0–15.0)
Total Lymphocyte: 27.3 %
WBC: 4.9 10*3/uL (ref 3.8–10.8)

## 2023-09-30 LAB — COMPLETE METABOLIC PANEL WITH GFR
AG Ratio: 1.4 (calc) (ref 1.0–2.5)
ALT: 17 U/L (ref 6–29)
AST: 19 U/L (ref 10–35)
Albumin: 4.2 g/dL (ref 3.6–5.1)
Alkaline phosphatase (APISO): 68 U/L (ref 37–153)
BUN: 14 mg/dL (ref 7–25)
CO2: 25 mmol/L (ref 20–32)
Calcium: 9.6 mg/dL (ref 8.6–10.4)
Chloride: 105 mmol/L (ref 98–110)
Creat: 0.87 mg/dL (ref 0.50–1.03)
Globulin: 3 g/dL (ref 1.9–3.7)
Glucose, Bld: 80 mg/dL (ref 65–99)
Potassium: 4.8 mmol/L (ref 3.5–5.3)
Sodium: 140 mmol/L (ref 135–146)
Total Bilirubin: 0.5 mg/dL (ref 0.2–1.2)
Total Protein: 7.2 g/dL (ref 6.1–8.1)
eGFR: 79 mL/min/{1.73_m2} (ref >=60)

## 2023-09-30 LAB — LIPID PANEL
Cholesterol: 204 mg/dL — ABNORMAL HIGH (ref <200)
HDL: 46 mg/dL — ABNORMAL LOW (ref >=50)
LDL Cholesterol (Calc): 140 mg/dL — ABNORMAL HIGH
Non-HDL Cholesterol (Calc): 158 mg/dL — ABNORMAL HIGH (ref <130)
Total CHOL/HDL Ratio: 4.4 (calc) (ref <5.0)
Triglycerides: 79 mg/dL (ref <150)

## 2023-10-05 ENCOUNTER — Encounter: Payer: Self-pay | Admitting: Family Medicine

## 2023-11-01 ENCOUNTER — Encounter: Payer: Self-pay | Admitting: Family Medicine

## 2023-11-01 ENCOUNTER — Ambulatory Visit (INDEPENDENT_AMBULATORY_CARE_PROVIDER_SITE_OTHER): Payer: 59 | Admitting: Family Medicine

## 2023-11-01 VITALS — BP 124/80 | HR 72 | Temp 98.0°F | Ht 63.0 in | Wt 201.0 lb

## 2023-11-01 DIAGNOSIS — Z124 Encounter for screening for malignant neoplasm of cervix: Secondary | ICD-10-CM | POA: Diagnosis not present

## 2023-11-01 DIAGNOSIS — Z0000 Encounter for general adult medical examination without abnormal findings: Secondary | ICD-10-CM | POA: Insufficient documentation

## 2023-11-01 NOTE — Addendum Note (Signed)
Addended by: Arta Silence on: 11/01/2023 03:13 PM   Modules accepted: Orders

## 2023-11-01 NOTE — Assessment & Plan Note (Signed)
 Today your medical history was reviewed and routine physical exam with labs was performed. Recommend 150 minutes of moderate intensity exercise weekly and consuming a well-balanced diet. Advised to stop smoking if a smoker, avoid smoking if a non-smoker, limit alcohol consumption to 1 drink per day for women and 2 drinks per day for men, and avoid illicit drug use. Counseled on safe sex practices and offered STI testing today. Counseled in mental health awareness and when to seek medical care. Vaccine maintenance discussed. Appropriate health maintenance items reviewed. Return to office in 1 year for annual physical exam.

## 2023-11-01 NOTE — Assessment & Plan Note (Signed)
Routine PAP, follow up as indicated by results

## 2023-11-01 NOTE — Progress Notes (Signed)
Subjective:   TNIA ANGLADA is a 58 y.o. female for annual routine Pap and checkup. Current Outpatient Medications  Medication Sig Dispense Refill   Multiple Vitamin (MULTIVITAMIN) tablet Take 1 tablet by mouth daily.     Probiotic Product (PROBIOTIC DAILY PO) Take by mouth.     promethazine-dextromethorphan (PROMETHAZINE-DM) 6.25-15 MG/5ML syrup Take 5 mLs by mouth at bedtime as needed for cough. Do not take with alcohol or while driving or operating heavy machinery.  May cause drowsiness. 118 mL 0   benzonatate (TESSALON) 200 MG capsule Take 1 capsule by mouth every 8 (eight) hours for cough. (Patient not taking: Reported on 11/01/2023) 30 capsule 0   ondansetron (ZOFRAN-ODT) 4 MG disintegrating tablet Take 1 tablet (4 mg total) by mouth every 8 (eight) hours as needed for nausea or vomiting. (Patient not taking: Reported on 11/01/2023) 20 tablet 0   No current facility-administered medications for this visit.   Allergies: Patient has no known allergies.  Patient's last menstrual period was 01/25/2011. Past Medical History:  Diagnosis Date   Diverticulitis of colon with perforation 08/22/2012   Elevated liver enzymes 01-24-13   4 weeks ago very elevated   GERD (gastroesophageal reflux disease)    Hypertension    Past Surgical History:  Procedure Laterality Date   childbirth  01-24-13   x3 -NVD   COLONOSCOPY  09/18/2012   INTRAOPERATIVE CHOLANGIOGRAM N/A 01/25/2013   Procedure: INTRAOPERATIVE CHOLANGIOGRAM;  Surgeon: Ardeth Sportsman, MD;  Location: [facility];  Service: General;  Laterality: N/A;   LAPAROSCOPIC CHOLECYSTECTOMY SINGLE PORT N/A 01/25/2013   Procedure: LAPAROSCOPIC CHOLECYSTECTOMY SINGLE PORT;  Surgeon: Ardeth Sportsman, MD;  Location: [facility];  Service: General;  Laterality: N/A;   UPPER GASTROINTESTINAL ENDOSCOPY       ROS:  Feeling well. No dyspnea or chest pain on exertion.  No abdominal pain, change in bowel habits, black or bloody stools.  No urinary tract symptoms.  GYN ROS: no breast pain or new or enlarging lumps on self exam, no vaginal bleeding, no discharge or pelvic pain, no hot flashes. No neurological complaints.  Objective:   The patient appears well, alert, oriented x 3, in no distress. BP 124/80   Pulse 72   Temp 98 F (36.7 C) (Oral)   Ht 5\' 3"  (1.6 m)   Wt 201 lb (91.2 kg)   LMP 01/25/2011   SpO2 98%   BMI 35.61 kg/m  ENT normal.  Neck supple. No adenopathy or thyromegaly. PERLA. Lungs are clear, good air entry, no wheezes, rhonchi or rales. S1 and S2 normal, no murmurs, regular rate and rhythm. Abdomen soft without tenderness, guarding, mass or organomegaly. Extremities show no edema, normal peripheral pulses. Neurological is normal, no focal findings.  BREAST EXAM: breasts appear normal, no suspicious masses, no skin or nipple changes or axillary nodes  PELVIC EXAM: normal external genitalia, vulva, vagina, cervix, uterus and adnexa, exam chaperoned by Arta Silence  Assessment & Plan:   well woman  PLAN:  mammogram pap smear return annually or prn    Physical exam, annual Assessment & Plan: Today your medical history was reviewed and routine physical exam with labs was performed. Recommend 150 minutes of moderate intensity exercise weekly and consuming a well-balanced diet. Advised to stop smoking if a smoker, avoid smoking if a non-smoker, limit alcohol consumption to 1 drink per day for women and 2 drinks per day for men, and avoid illicit drug use. Counseled on safe sex practices and offered STI  testing today. Counseled in mental health awareness and when to seek medical care. Vaccine maintenance discussed. Appropriate health maintenance items reviewed. Return to office in 1 year for annual physical exam.    Cervical cancer screening Assessment & Plan: Routine PAP, follow up as indicated by results  Orders: -     Pap, TP Imaging w/ CT/GC and w/ HPV RNA, rflx HPV Type 16/18     Follow up plan: Return in about 6  months (around 04/30/2024) for chronic follow-up with labs 1 week prior.  Park Meo, FNP

## 2023-11-04 ENCOUNTER — Telehealth: Payer: Self-pay

## 2023-11-04 ENCOUNTER — Encounter: Payer: Self-pay | Admitting: Family Medicine

## 2023-11-04 DIAGNOSIS — Z1211 Encounter for screening for malignant neoplasm of colon: Secondary | ICD-10-CM

## 2023-11-04 LAB — PAP, TP IMAGING W/ HPV RNA, RFLX HPV TYPE 16,18/45: HPV DNA High Risk: NOT DETECTED

## 2023-11-04 LAB — PAP, TP IMAGING W/ CT/GC AND W/ HPV RNA, RFLX HPV TYPE 16/18

## 2023-11-04 LAB — C. TRACHOMATIS/N. GONORRHOEAE RNA
C. trachomatis RNA, TMA: NOT DETECTED
N. gonorrhoeae RNA, TMA: NOT DETECTED

## 2023-11-04 NOTE — Telephone Encounter (Signed)
Copied from CRM [id number]. Topic: Referral - Request for Referral >> Nov 04, 2023 10:52 AM Kristie Cowman wrote: Did the patient discuss referral with their provider in the last year? Yes (If No - schedule appointment) (If Yes - send message)  Appointment offered? No  Type of order/referral and detailed reason for visit: Colonoscopy  Preference of office, provider, location: Noone  If referral order, have you been seen by this specialty before? No (If Yes, this issue or another issue? When? Where?  Can we respond through MyChart? Yes

## 2023-11-07 ENCOUNTER — Encounter: Payer: Self-pay | Admitting: Family Medicine

## 2023-11-08 ENCOUNTER — Ambulatory Visit
Admission: RE | Admit: 2023-11-08 | Discharge: 2023-11-08 | Disposition: A | Discharge status: Home / Self Care | Payer: 59 | Source: Ambulatory Visit | Attending: Family Medicine | Admitting: Family Medicine

## 2023-11-08 DIAGNOSIS — Z1231 Encounter for screening mammogram for malignant neoplasm of breast: Secondary | ICD-10-CM | POA: Diagnosis not present

## 2023-12-14 ENCOUNTER — Ambulatory Visit (INDEPENDENT_AMBULATORY_CARE_PROVIDER_SITE_OTHER): Payer: 59

## 2023-12-14 DIAGNOSIS — Z23 Encounter for immunization: Secondary | ICD-10-CM

## 2023-12-14 NOTE — Progress Notes (Signed)
 Patient is in office today for a nurse visit for Immunization. Patient Injection was given in the  Right deltoid. Patient tolerated injection well.

## 2023-12-16 ENCOUNTER — Ambulatory Visit
Admission: EM | Admit: 2023-12-16 | Discharge: 2023-12-16 | Disposition: A | Discharge status: Home / Self Care | Attending: Family Medicine | Admitting: Family Medicine

## 2023-12-16 DIAGNOSIS — L03012 Cellulitis of left finger: Secondary | ICD-10-CM | POA: Diagnosis not present

## 2023-12-16 MED ORDER — CEPHALEXIN 500 MG PO CAPS: 500.0000 mg | ORAL_CAPSULE | Freq: Two times a day (BID) | ORAL | 0 refills | Status: AC

## 2023-12-16 NOTE — ED Provider Notes (Signed)
 [facility]    CSN: [id number] Arrival date & time: 12/16/23  0854      History   Chief Complaint No chief complaint on file.   HPI Lisa Tyler is a 58 y.o. female.   Patient presenting today with 3-day history of pain, swelling, redness to the nailbed of the left middle finger.  States she pulled a hangnail in this area and it has since led to this.  Denies drainage, bleeding, fevers, chills, nausea, vomiting.  So far not trying anything over-the-counter for symptoms.    Past Medical History:  Diagnosis Date   Diverticulitis of colon with perforation 08/22/2012   Elevated liver enzymes 01-24-13   4 weeks ago very elevated   GERD (gastroesophageal reflux disease)    Hypertension     Patient Active Problem List   Diagnosis Date Noted   Physical exam, annual 11/01/2023   Cervical cancer screening 11/01/2023   Class 2 severe obesity with serious comorbidity and body mass index (BMI) of 38.0 to 38.9 in adult [facility]) 09/29/2023   Class 2 severe obesity due to excess calories with serious comorbidity and body mass index (BMI) of 35.0 to 35.9 in adult (HCC) 04/03/2021   HTN (hypertension) 03/24/2020   GERD (gastroesophageal reflux disease) 03/24/2020    Past Surgical History:  Procedure Laterality Date   childbirth  01-24-13   x3 -NVD   COLONOSCOPY  09/18/2012   INTRAOPERATIVE CHOLANGIOGRAM N/A 01/25/2013   Procedure: INTRAOPERATIVE CHOLANGIOGRAM;  Surgeon: Ardeth Sportsman, MD;  Location: [facility];  Service: General;  Laterality: N/A;   LAPAROSCOPIC CHOLECYSTECTOMY SINGLE PORT N/A 01/25/2013   Procedure: LAPAROSCOPIC CHOLECYSTECTOMY SINGLE PORT;  Surgeon: Ardeth Sportsman, MD;  Location: [facility];  Service: General;  Laterality: N/A;   UPPER GASTROINTESTINAL ENDOSCOPY      OB History   No obstetric history on file.      Home Medications    Prior to Admission medications   Medication Sig Start Date End Date Taking? Authorizing Provider  cephALEXin (KEFLEX) 500  MG capsule Take 1 capsule (500 mg total) by mouth 2 (two) times daily. 12/16/23  Yes Particia Nearing, PA-C  benzonatate (TESSALON) 200 MG capsule Take 1 capsule by mouth every 8 (eight) hours for cough. Patient not taking: Reported on 11/01/2023 01/01/23   Mardella Layman, MD  Multiple Vitamin (MULTIVITAMIN) tablet Take 1 tablet by mouth daily.    [provider]  ondansetron (ZOFRAN-ODT) 4 MG disintegrating tablet Take 1 tablet (4 mg total) by mouth every 8 (eight) hours as needed for nausea or vomiting. Patient not taking: Reported on 11/01/2023 01/24/23   Valentino Nose, NP  Probiotic Product (PROBIOTIC DAILY PO) Take by mouth.    [provider]  promethazine-dextromethorphan (PROMETHAZINE-DM) 6.25-15 MG/5ML syrup Take 5 mLs by mouth at bedtime as needed for cough. Do not take with alcohol or while driving or operating heavy machinery.  May cause drowsiness. 01/24/23   Valentino Nose, NP    Family History Family History  Problem Relation Age of Onset   Crohn's disease Cousin        Maternal cousin   Breast cancer Maternal Aunt    Colon cancer Father        63's   Diabetes Maternal Grandmother    Colitis Maternal Grandmother    Breast cancer Maternal Grandmother    Hyperlipidemia Maternal Grandmother    Hypertension Maternal Grandmother    Hypertension Mother    Lung cancer Maternal Grandfather  Colon polyps Neg Hx    Esophageal cancer Neg Hx    Rectal cancer Neg Hx    Stomach cancer Neg Hx     Social History Social History   Tobacco Use   Smoking status: Never   Smokeless tobacco: Never  Vaping Use   Vaping status: Never Used  Substance Use Topics   Alcohol use: Yes    Alcohol/week: 1.0 standard drink of alcohol    Types: 1 Glasses of wine per week    Comment: occasional   Drug use: No     Allergies   Patient has no known allergies.   Review of Systems Review of Systems Per HPI  Physical Exam Triage Vital Signs ED Triage Vitals  [12/16/23 0858]  Encounter Vitals Group     BP 136/82     Systolic BP Percentile      Diastolic BP Percentile      Pulse Rate 73     Resp 20     Temp 98.5 F (36.9 C)     Temp Source Oral     SpO2 94 %     Weight      Height      Head Circumference      Peak Flow      Pain Score 6     Pain Loc      Pain Education      Exclude from Growth Chart    No data found.  Updated Vital Signs BP 136/82 (BP Location: Right Arm)   Pulse 73   Temp 98.5 F (36.9 C) (Oral)   Resp 20   LMP 01/25/2011   SpO2 94%   Visual Acuity Right Eye Distance:   Left Eye Distance:   Bilateral Distance:    Right Eye Near:   Left Eye Near:    Bilateral Near:     Physical Exam Vitals and nursing note reviewed.  Constitutional:      Appearance: Normal appearance. She is not ill-appearing.  HENT:     Head: Atraumatic.  Eyes:     Extraocular Movements: Extraocular movements intact.     Conjunctiva/sclera: Conjunctivae normal.  Cardiovascular:     Rate and Rhythm: Normal rate and regular rhythm.     Heart sounds: Normal heart sounds.  Pulmonary:     Effort: Pulmonary effort is normal.     Breath sounds: Normal breath sounds.  Musculoskeletal:        General: Swelling and tenderness present. Normal range of motion.     Cervical back: Normal range of motion and neck supple.  Skin:    General: Skin is warm and dry.     Findings: Erythema present. No bruising.     Comments: Tiny purulent pocket forming to the nailbed of the left middle finger, surrounding erythema, edema to the distal fingertip.  Tender to palpation.  No active drainage or bleeding  Neurological:     Mental Status: She is alert and oriented to person, place, and time.     Motor: No weakness.     Gait: Gait normal.     Comments: Left upper extremity neurovascularly intact  Psychiatric:        Mood and Affect: Mood normal.        Thought Content: Thought content normal.        Judgment: Judgment normal.      UC  Treatments / Results  Labs (all labs ordered are listed, but only abnormal results are displayed) Labs Reviewed -  No data to display  EKG   Radiology No results found.  Procedures Procedures (including critical care time)  Medications Ordered in UC Medications - No data to display  Initial Impression / Assessment and Plan / UC Course  I have reviewed the triage vital signs and the nursing notes.  Pertinent labs & imaging results that were available during my care of the patient were reviewed by me and considered in my medical decision making (see chart for details).     Will treat for paronychia with Keflex, good home wound care, ice, elevation, Epsom salt soaks.  Discussed return precautions with patient.  Return for worsening symptoms.  Final Clinical Impressions(s) / UC Diagnoses   Final diagnoses:  Paronychia of finger of left hand     Discharge Instructions      Clean the area with either soap and water or Hibiclens solution once to twice daily and apply Neosporin and a bandage.  Epsom salt soaks as needed, elevate at rest, ice off-and-on to help with swelling.  Take the full course of antibiotics and follow-up for worsening symptoms    ED Prescriptions     Medication Sig Dispense Auth. Provider   cephALEXin (KEFLEX) 500 MG capsule Take 1 capsule (500 mg total) by mouth 2 (two) times daily. 14 capsule Particia Nearing, [place]      PDMP not reviewed this encounter.   Particia Nearing, [place] 12/16/23 1010

## 2023-12-16 NOTE — ED Triage Notes (Signed)
 Pt reports she has a swollen red left middle finger  x 3 days

## 2023-12-16 NOTE — Discharge Instructions (Addendum)
 Clean the area with either soap and water or Hibiclens solution once to twice daily and apply Neosporin and a bandage.  Epsom salt soaks as needed, elevate at rest, ice off-and-on to help with swelling.  Take the full course of antibiotics and follow-up for worsening symptoms

## 2024-04-24 ENCOUNTER — Other Ambulatory Visit: Payer: 59

## 2024-05-01 ENCOUNTER — Ambulatory Visit: Payer: 59 | Admitting: Family Medicine

## 2024-05-16 ENCOUNTER — Ambulatory Visit: Admitting: Family Medicine

## 2024-07-27 ENCOUNTER — Telehealth: Payer: Self-pay | Admitting: Family Medicine

## 2024-07-27 NOTE — Telephone Encounter (Signed)
 Copied from CRM [id number]. Topic: Referral - Request for Referral >> Jul 27, 2024  9:46 AM Everette C wrote: Did the patient discuss referral with their provider in the last year? No (If No - schedule appointment) (If Yes - send message)  Appointment offered? No  Type of order/referral and detailed reason for visit: GI / Endoscopy   Preference of office, provider, location: Patient would like to be seen at [facility]   If referral order, have you been seen by this specialty before? Yes (If Yes, this issue or another issue? When? Where?  Can we respond through MyChart? No

## 2024-08-03 ENCOUNTER — Other Ambulatory Visit: Payer: Self-pay

## 2024-08-03 DIAGNOSIS — Z1211 Encounter for screening for malignant neoplasm of colon: Secondary | ICD-10-CM

## 2024-08-03 NOTE — Progress Notes (Cosign Needed Addendum)
 Please review and advise:   Called and spoke to pt. She is calling to request her follow up for a colonoscopy not endoscopy. She stated that due to her family history she wants to follow up. I  Checked  her last colonoscopy note  from 07/23/2019 and she is to follow up in 5 years. Placed order and awaiting your review and cosign.  Thank you.   Original note:   Did the patient discuss referral with their provider in the last year? No (If No - schedule appointment) (If Yes - send message)   Appointment offered? No   Type of order/referral and detailed reason for visit: GI / Endoscopy    Preference of office, provider, location: Patient would like to be seen at [facility]    If referral order, have you been seen by this specialty before? Yes (If Yes, this issue or another issue? When? Where?   Can we respond through MyChart? No

## 2024-10-11 ENCOUNTER — Ambulatory Visit: Payer: Self-pay

## 2024-10-11 VITALS — Ht 64.0 in | Wt 185.0 lb

## 2024-10-11 DIAGNOSIS — Z800 Family history of malignant neoplasm of digestive organs: Secondary | ICD-10-CM

## 2024-10-11 MED ORDER — NA SULFATE-K SULFATE-MG SULF 17.5-3.13-1.6 GM/177ML PO SOLN: 1.0000 | Freq: Once | ORAL | 0 refills | Status: AC

## 2024-10-11 NOTE — Progress Notes (Signed)

## 2024-10-15 ENCOUNTER — Encounter: Payer: Self-pay | Admitting: Gastroenterology

## 2024-10-25 ENCOUNTER — Encounter: Admitting: Gastroenterology

## 2024-10-29 ENCOUNTER — Other Ambulatory Visit: Payer: 59

## 2024-11-01 ENCOUNTER — Encounter: Payer: 59 | Admitting: Family Medicine
